# Patient Record
Sex: Female | Born: 1985
Health system: Southern US, Community
[De-identification: ages and names within clinical notes are randomized; demographics above are authoritative.]

## PROBLEM LIST (undated history)

## (undated) DIAGNOSIS — Z9889 Other specified postprocedural states: Secondary | ICD-10-CM

## (undated) DIAGNOSIS — N390 Urinary tract infection, site not specified: Secondary | ICD-10-CM

## (undated) DIAGNOSIS — N1370 Vesicoureteral-reflux, unspecified: Secondary | ICD-10-CM

## (undated) DIAGNOSIS — N189 Chronic kidney disease, unspecified: Secondary | ICD-10-CM

## (undated) DIAGNOSIS — F32A Depression, unspecified: Secondary | ICD-10-CM

## (undated) DIAGNOSIS — F329 Major depressive disorder, single episode, unspecified: Secondary | ICD-10-CM

## (undated) DIAGNOSIS — F419 Anxiety disorder, unspecified: Secondary | ICD-10-CM

## (undated) DIAGNOSIS — B999 Unspecified infectious disease: Secondary | ICD-10-CM

## (undated) DIAGNOSIS — J45909 Unspecified asthma, uncomplicated: Secondary | ICD-10-CM

## (undated) DIAGNOSIS — R112 Nausea with vomiting, unspecified: Secondary | ICD-10-CM

## (undated) HISTORY — PX: URINARY SURGERY: SHX2626

## (undated) HISTORY — PX: NECK SURGERY: SHX720

## (undated) HISTORY — DX: Depression, unspecified: F32.A

## (undated) HISTORY — PX: HERNIA REPAIR: SHX51

## (undated) HISTORY — DX: Major depressive disorder, single episode, unspecified: F32.9

## (undated) HISTORY — DX: Chronic kidney disease, unspecified: N18.9

## (undated) HISTORY — DX: Unspecified infectious disease: B99.9

## (undated) HISTORY — PX: MOUTH SURGERY: SHX715

---
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
## 2007-05-19 ENCOUNTER — Emergency Department (HOSPITAL_COMMUNITY): Admission: EM | Admit: 2007-05-19 | Discharge: 2007-05-19 | Payer: Self-pay | Admitting: Emergency Medicine

## 2009-10-06 ENCOUNTER — Inpatient Hospital Stay (HOSPITAL_COMMUNITY): Admission: AD | Admit: 2009-10-06 | Discharge: 2009-10-07 | Payer: Self-pay | Admitting: Obstetrics & Gynecology

## 2009-10-06 ENCOUNTER — Other Ambulatory Visit: Payer: Self-pay | Admitting: Emergency Medicine

## 2009-10-06 DIAGNOSIS — N390 Urinary tract infection, site not specified: Secondary | ICD-10-CM

## 2009-10-06 DIAGNOSIS — O2390 Unspecified genitourinary tract infection in pregnancy, unspecified trimester: Secondary | ICD-10-CM

## 2010-01-30 ENCOUNTER — Inpatient Hospital Stay (HOSPITAL_COMMUNITY)
Admission: AD | Admit: 2010-01-30 | Discharge: 2010-02-01 | Payer: Self-pay | Source: Home / Self Care | Attending: Obstetrics & Gynecology | Admitting: Obstetrics & Gynecology

## 2010-02-04 LAB — CBC
HCT: 34.2 % — ABNORMAL LOW (ref 36.0–46.0)
HCT: 27.8 % — ABNORMAL LOW (ref 36.0–46.0)
Hemoglobin: 12.4 g/dL (ref 12.0–15.0)
Hemoglobin: 10.1 g/dL — ABNORMAL LOW (ref 12.0–15.0)
MCH: 34.5 pg — ABNORMAL HIGH (ref 26.0–34.0)
MCH: 34.9 pg — ABNORMAL HIGH (ref 26.0–34.0)
MCHC: 36.3 g/dL — ABNORMAL HIGH (ref 30.0–36.0)
MCHC: 36.3 g/dL — ABNORMAL HIGH (ref 30.0–36.0)
MCV: 95.3 fL (ref 78.0–100.0)
MCV: 96.2 fL (ref 78.0–100.0)
Platelets: 217 10*3/uL (ref 150–400)
Platelets: 210 10*3/uL (ref 150–400)
RBC: 3.59 MIL/uL — ABNORMAL LOW (ref 3.87–5.11)
RBC: 2.89 MIL/uL — ABNORMAL LOW (ref 3.87–5.11)
RDW: 13.9 % (ref 11.5–15.5)
RDW: 13.9 % (ref 11.5–15.5)
WBC: 13.9 10*3/uL — ABNORMAL HIGH (ref 4.0–10.5)
WBC: 20 10*3/uL — ABNORMAL HIGH (ref 4.0–10.5)

## 2010-02-04 LAB — RPR: RPR Ser Ql: NONREACTIVE

## 2010-04-04 LAB — URINALYSIS, ROUTINE W REFLEX MICROSCOPIC
Bilirubin Urine: NEGATIVE
Glucose, UA: NEGATIVE mg/dL
Hgb urine dipstick: NEGATIVE
Ketones, ur: NEGATIVE mg/dL
Nitrite: POSITIVE — AB
Protein, ur: NEGATIVE mg/dL
Specific Gravity, Urine: 1.012 (ref 1.005–1.030)
Urobilinogen, UA: 0.2 mg/dL (ref 0.0–1.0)
pH: 7 (ref 5.0–8.0)

## 2010-04-04 LAB — URINE MICROSCOPIC-ADD ON

## 2010-10-15 LAB — BASIC METABOLIC PANEL
BUN: 6
CO2: 27
Calcium: 9.1
Chloride: 106
Creatinine, Ser: 0.67
GFR calc Af Amer: >60
GFR calc non Af Amer: >60
Glucose, Bld: 101 — ABNORMAL HIGH
Potassium: 3.8
Sodium: 141

## 2010-10-15 LAB — DIFFERENTIAL
Basophils Absolute: 0
Basophils Relative: 1
Eosinophils Absolute: 0
Eosinophils Relative: 1
Lymphocytes Relative: 23
Lymphs Abs: 1.1
Monocytes Absolute: 0.5
Monocytes Relative: 12
Neutro Abs: 3
Neutrophils Relative %: 64

## 2010-10-15 LAB — URINE MICROSCOPIC-ADD ON

## 2010-10-15 LAB — URINALYSIS, ROUTINE W REFLEX MICROSCOPIC
Bilirubin Urine: NEGATIVE
Glucose, UA: NEGATIVE
Hgb urine dipstick: NEGATIVE
Ketones, ur: NEGATIVE
Nitrite: NEGATIVE
Protein, ur: NEGATIVE
Specific Gravity, Urine: 1.008
Urobilinogen, UA: 0.2
pH: 6.5

## 2010-10-15 LAB — CBC
HCT: 42.3
Hemoglobin: 14.6
MCHC: 34.5
MCV: 94
Platelets: 244
RBC: 4.5
RDW: 12.5
WBC: 4.7

## 2010-10-15 LAB — PREGNANCY, URINE: Preg Test, Ur: NEGATIVE

## 2011-03-28 ENCOUNTER — Other Ambulatory Visit: Payer: Self-pay | Admitting: Otolaryngology

## 2011-03-28 DIAGNOSIS — Q892 Congenital malformations of other endocrine glands: Secondary | ICD-10-CM

## 2011-04-01 ENCOUNTER — Ambulatory Visit
Admission: RE | Admit: 2011-04-01 | Discharge: 2011-04-01 | Disposition: A | Discharge status: Home / Self Care | Payer: 59 | Source: Ambulatory Visit | Attending: Otolaryngology | Admitting: Otolaryngology

## 2011-04-01 DIAGNOSIS — Q892 Congenital malformations of other endocrine glands: Secondary | ICD-10-CM

## 2011-04-01 MED ORDER — IOHEXOL 300 MG/ML  SOLN
75.0000 mL | Freq: Once | INTRAMUSCULAR | Status: AC | PRN
  Administered 2011-04-01: 75 mL via INTRAVENOUS

## 2011-04-25 ENCOUNTER — Encounter (HOSPITAL_COMMUNITY): Payer: Self-pay | Admitting: Pharmacy Technician

## 2011-04-30 ENCOUNTER — Other Ambulatory Visit: Payer: Self-pay | Admitting: Otolaryngology

## 2011-05-05 ENCOUNTER — Encounter (HOSPITAL_COMMUNITY): Payer: Self-pay

## 2011-05-05 ENCOUNTER — Encounter (HOSPITAL_COMMUNITY)
Admission: RE | Admit: 2011-05-05 | Discharge: 2011-05-05 | Disposition: A | Discharge status: Home / Self Care | Payer: 59 | Source: Ambulatory Visit | Attending: Otolaryngology | Admitting: Otolaryngology

## 2011-05-05 HISTORY — DX: Other specified postprocedural states: R11.2

## 2011-05-05 HISTORY — DX: Vesicoureteral-reflux, unspecified: N13.70

## 2011-05-05 HISTORY — DX: Other specified postprocedural states: Z98.890

## 2011-05-05 HISTORY — DX: Anxiety disorder, unspecified: F41.9

## 2011-05-05 LAB — CBC
HCT: 44 % (ref 36.0–46.0)
MCHC: 35.2 g/dL (ref 30.0–36.0)
RDW: 12.9 % (ref 11.5–15.5)
WBC: 8.1 10*3/uL (ref 4.0–10.5)

## 2011-05-05 LAB — SURGICAL PCR SCREEN
MRSA, PCR: NEGATIVE
Staphylococcus aureus: NEGATIVE

## 2011-05-05 LAB — HCG, SERUM, QUALITATIVE: Preg, Serum: NEGATIVE

## 2011-05-05 NOTE — Pre-Procedure Instructions (Signed)
20 Morgan Day  05/05/2011   Your procedure is scheduled on:  Friday, April 19th @ 7:30AM.  Report to [facility] at 5:30 AM.  Call this number if you have problems the morning of surgery: [phone]   Remember:   Do not eat food:After Midnight.  May have clear liquids: up to 4 Hours before arrival(nothing after 1:30AM).  Clear liquids include soda, tea, black coffee, apple or grape juice, broth.  Take these medicines the morning of surgery with A SIP OF WATER: Clonazepam   Do not wear jewelry, make-up or nail polish.  Do not wear lotions, powders, or perfumes. You may wear deodorant.  Do not shave 48 hours prior to surgery.  Do not bring valuables to the hospital.  Contacts, dentures or bridgework may not be worn into surgery.  Leave suitcase in the car. After surgery it may be brought to your room.  For patients admitted to the hospital, checkout time is 11:00 AM the day of discharge.   Patients discharged the day of surgery will not be allowed to drive home.  Name and phone number of your driver:   Special Instructions: CHG Shower Use Special Wash: 1/2 bottle night before surgery and 1/2 bottle morning of surgery.   Please read over the following fact sheets that you were given: Pain Booklet, Coughing and Deep Breathing and Surgical Site Infection Prevention

## 2011-05-05 NOTE — Pre-Procedure Instructions (Signed)
20 Morgan Day  05/05/2011   Your procedure is scheduled on:  Friday, April 19th @ 7:30AM.  Report to [facility] at 5:30 AM.  Call this number if you have problems the morning of surgery: [phone]   Remember:   Do not eat food:After Midnight.  May have clear liquids: up to 4 Hours before arrival(nothing after 1:30AM).  Clear liquids include soda, tea, black coffee, apple or grape juice, broth.  Take these medicines the morning of surgery with A SIP OF WATER: Clonazepam  Stop omega 3, multi vit, coq10 now   Do not wear jewelry, make-up or nail polish.  Do not wear lotions, powders, or perfumes. You may wear deodorant.  Do not shave 48 hours prior to surgery.  Do not bring valuables to the hospital.  Contacts, dentures or bridgework may not be worn into surgery.  Leave suitcase in the car. After surgery it may be brought to your room.  For patients admitted to the hospital, checkout time is 11:00 AM the day of discharge.   Patients discharged the day of surgery will not be allowed to drive home.  Name and phone number of your driver: Morgan Day [phone]  Special Instructions: CHG Shower Use Special Wash: 1/2 bottle night before surgery and 1/2 bottle morning of surgery.   Please read over the following fact sheets that you were given: Pain Booklet, Coughing and Deep Breathing and Surgical Site Infection Prevention

## 2011-05-08 MED ORDER — CEFAZOLIN SODIUM 1-5 GM-% IV SOLN
1.0000 g | INTRAVENOUS | Status: AC
  Administered 2011-05-09: 1 g via INTRAVENOUS
  Filled 2011-05-08: qty 50

## 2011-05-08 MED ORDER — DEXAMETHASONE SODIUM PHOSPHATE 10 MG/ML IJ SOLN
10.0000 mg | Freq: Once | INTRAMUSCULAR | Status: AC
  Administered 2011-05-09: 10 mg via INTRAVENOUS
  Filled 2011-05-08: qty 1

## 2011-05-09 ENCOUNTER — Encounter (HOSPITAL_COMMUNITY): Payer: Self-pay | Admitting: Anesthesiology

## 2011-05-09 ENCOUNTER — Ambulatory Visit (HOSPITAL_COMMUNITY)
Admission: RE | Admit: 2011-05-09 | Discharge: 2011-05-10 | Disposition: A | Discharge status: Home / Self Care | Payer: 59 | Source: Ambulatory Visit | Attending: Otolaryngology | Admitting: Otolaryngology

## 2011-05-09 ENCOUNTER — Encounter (HOSPITAL_COMMUNITY): Payer: Self-pay | Admitting: *Deleted

## 2011-05-09 ENCOUNTER — Encounter (HOSPITAL_COMMUNITY)
Admission: RE | Disposition: A | Discharge status: Home / Self Care | Payer: Self-pay | Source: Ambulatory Visit | Attending: Otolaryngology

## 2011-05-09 ENCOUNTER — Ambulatory Visit (HOSPITAL_COMMUNITY): Payer: 59 | Admitting: Anesthesiology

## 2011-05-09 DIAGNOSIS — R221 Localized swelling, mass and lump, neck: Secondary | ICD-10-CM | POA: Insufficient documentation

## 2011-05-09 DIAGNOSIS — Z01812 Encounter for preprocedural laboratory examination: Secondary | ICD-10-CM | POA: Insufficient documentation

## 2011-05-09 DIAGNOSIS — Q892 Congenital malformations of other endocrine glands: Secondary | ICD-10-CM | POA: Insufficient documentation

## 2011-05-09 DIAGNOSIS — F411 Generalized anxiety disorder: Secondary | ICD-10-CM | POA: Insufficient documentation

## 2011-05-09 DIAGNOSIS — R220 Localized swelling, mass and lump, head: Secondary | ICD-10-CM | POA: Insufficient documentation

## 2011-05-09 DIAGNOSIS — J45909 Unspecified asthma, uncomplicated: Secondary | ICD-10-CM | POA: Insufficient documentation

## 2011-05-09 DIAGNOSIS — F17200 Nicotine dependence, unspecified, uncomplicated: Secondary | ICD-10-CM | POA: Insufficient documentation

## 2011-05-09 HISTORY — PX: THYROGLOSSAL DUCT CYST: SHX297

## 2011-05-09 SURGERY — EXCISION, THYROGLOSSAL DUCT CYST
Anesthesia: General | Site: Neck | Wound class: Clean

## 2011-05-09 MED ORDER — LIDOCAINE HCL 4 % MT SOLN
OROMUCOSAL | Status: DC | PRN
  Administered 2011-05-09: 4 mL via TOPICAL

## 2011-05-09 MED ORDER — ONDANSETRON HCL 4 MG/2ML IJ SOLN: 4.0000 mg | Freq: Once | INTRAMUSCULAR | Status: DC | PRN

## 2011-05-09 MED ORDER — MORPHINE SULFATE 4 MG/ML IJ SOLN
2.0000 mg | INTRAMUSCULAR | Status: DC | PRN
  Administered 2011-05-09 (×2): 4 mg via INTRAVENOUS
  Filled 2011-05-09: qty 1

## 2011-05-09 MED ORDER — LIDOCAINE-EPINEPHRINE 1 %-1:100000 IJ SOLN
INTRAMUSCULAR | Status: DC | PRN
  Administered 2011-05-09: 1 mL

## 2011-05-09 MED ORDER — CEFAZOLIN SODIUM 1-5 GM-% IV SOLN
1.0000 g | Freq: Three times a day (TID) | INTRAVENOUS | Status: DC
  Administered 2011-05-09 – 2011-05-10 (×3): 1 g via INTRAVENOUS
  Filled 2011-05-09 (×5): qty 50

## 2011-05-09 MED ORDER — ARTIFICIAL TEARS OP OINT
TOPICAL_OINTMENT | OPHTHALMIC | Status: DC | PRN
  Administered 2011-05-09: 1 via OPHTHALMIC

## 2011-05-09 MED ORDER — DEXTROSE IN LACTATED RINGERS 5 % IV SOLN
INTRAVENOUS | Status: DC
  Administered 2011-05-10: 01:00:00 via INTRAVENOUS

## 2011-05-09 MED ORDER — NEOSTIGMINE METHYLSULFATE 1 MG/ML IJ SOLN
INTRAMUSCULAR | Status: DC | PRN
  Administered 2011-05-09: 3 mg via INTRAVENOUS

## 2011-05-09 MED ORDER — HYDROCODONE-ACETAMINOPHEN 7.5-500 MG/15ML PO SOLN
10.0000 mL | ORAL | Status: DC | PRN
  Administered 2011-05-09 – 2011-05-10 (×2): 15 mL via ORAL
  Filled 2011-05-09 (×2): qty 15

## 2011-05-09 MED ORDER — ONDANSETRON HCL 4 MG PO TABS: 4.0000 mg | ORAL_TABLET | ORAL | Status: DC | PRN

## 2011-05-09 MED ORDER — HYDROMORPHONE HCL PF 1 MG/ML IJ SOLN
0.2500 mg | INTRAMUSCULAR | Status: DC | PRN
  Administered 2011-05-09: 0.5 mg via INTRAVENOUS
  Administered 2011-05-09 (×2): 0.25 mg via INTRAVENOUS

## 2011-05-09 MED ORDER — ACETAMINOPHEN 160 MG/5ML PO SOLN: 650.0000 mg | ORAL | Status: DC | PRN

## 2011-05-09 MED ORDER — ACETAMINOPHEN 650 MG RE SUPP: 650.0000 mg | RECTAL | Status: DC | PRN

## 2011-05-09 MED ORDER — LACTATED RINGERS IV SOLN
INTRAVENOUS | Status: DC | PRN
  Administered 2011-05-09: 07:00:00 via INTRAVENOUS

## 2011-05-09 MED ORDER — LIDOCAINE HCL 1 % IJ SOLN
INTRAMUSCULAR | Status: DC | PRN
  Administered 2011-05-09: 80 mg via INTRADERMAL

## 2011-05-09 MED ORDER — MIDAZOLAM HCL 5 MG/5ML IJ SOLN
INTRAMUSCULAR | Status: DC | PRN
  Administered 2011-05-09: 2 mg via INTRAVENOUS

## 2011-05-09 MED ORDER — SODIUM CHLORIDE 0.9 % IR SOLN
Status: DC | PRN
  Administered 2011-05-09: 1000 mL

## 2011-05-09 MED ORDER — ONDANSETRON HCL 4 MG/2ML IJ SOLN
4.0000 mg | INTRAMUSCULAR | Status: DC | PRN
  Administered 2011-05-09 (×3): 4 mg via INTRAVENOUS
  Filled 2011-05-09 (×3): qty 2

## 2011-05-09 MED ORDER — ROCURONIUM BROMIDE 100 MG/10ML IV SOLN
INTRAVENOUS | Status: DC | PRN
  Administered 2011-05-09: 40 mg via INTRAVENOUS

## 2011-05-09 MED ORDER — MORPHINE SULFATE 2 MG/ML IJ SOLN: 0.0500 mg/kg | INTRAMUSCULAR | Status: DC | PRN

## 2011-05-09 MED ORDER — ONDANSETRON HCL 4 MG/2ML IJ SOLN
INTRAMUSCULAR | Status: DC | PRN
  Administered 2011-05-09 (×2): 4 mg via INTRAVENOUS

## 2011-05-09 MED ORDER — HYDROCODONE-ACETAMINOPHEN 5-325 MG PO TABS
1.0000 | ORAL_TABLET | ORAL | Status: DC | PRN
  Administered 2011-05-10: 1 via ORAL
  Filled 2011-05-09: qty 1

## 2011-05-09 MED ORDER — PROPOFOL 10 MG/ML IV EMUL
INTRAVENOUS | Status: DC | PRN
  Administered 2011-05-09: 120 mg via INTRAVENOUS

## 2011-05-09 MED ORDER — MORPHINE SULFATE 4 MG/ML IJ SOLN
INTRAMUSCULAR | Status: AC
  Administered 2011-05-09: 4 mg via INTRAVENOUS
  Filled 2011-05-09: qty 1

## 2011-05-09 MED ORDER — FENTANYL CITRATE 0.05 MG/ML IJ SOLN
INTRAMUSCULAR | Status: DC | PRN
  Administered 2011-05-09: 150 ug via INTRAVENOUS

## 2011-05-09 MED ORDER — CLONAZEPAM 0.5 MG PO TABS: 0.5000 mg | ORAL_TABLET | Freq: Two times a day (BID) | ORAL | Status: DC | PRN

## 2011-05-09 MED ORDER — GLYCOPYRROLATE 0.2 MG/ML IJ SOLN
INTRAMUSCULAR | Status: DC | PRN
  Administered 2011-05-09: 0.4 mg via INTRAVENOUS

## 2011-05-09 SURGICAL SUPPLY — 45 items
APPLIER CLIP 9.375 SM OPEN (CLIP)
BLADE SURG 10 STRL SS (BLADE) ×2 IMPLANT
CANISTER SUCTION 2500CC (MISCELLANEOUS) ×2 IMPLANT
CLEANER TIP ELECTROSURG 2X2 (MISCELLANEOUS) ×2 IMPLANT
CLIP APPLIE 9.375 SM OPEN (CLIP) IMPLANT
CLOTH BEACON ORANGE TIMEOUT ST (SAFETY) ×2 IMPLANT
CONT SPEC 4OZ CLIKSEAL STRL BL (MISCELLANEOUS) IMPLANT
CORDS BIPOLAR (ELECTRODE) ×2 IMPLANT
COVER SURGICAL LIGHT HANDLE (MISCELLANEOUS) ×2 IMPLANT
CRADLE DONUT ADULT HEAD (MISCELLANEOUS) ×2 IMPLANT
DECANTER SPIKE VIAL GLASS SM (MISCELLANEOUS) ×2 IMPLANT
DERMABOND ADVANCED (GAUZE/BANDAGES/DRESSINGS) ×1
DERMABOND ADVANCED .7 DNX12 (GAUZE/BANDAGES/DRESSINGS) ×1 IMPLANT
DRAIN JACKSON RD 7FR 3/32 (WOUND CARE) ×2 IMPLANT
ELECT COATED BLADE 2.86 ST (ELECTRODE) ×2 IMPLANT
ELECT REM PT RETURN 9FT ADLT (ELECTROSURGICAL) ×2
ELECTRODE REM PT RTRN 9FT ADLT (ELECTROSURGICAL) ×1 IMPLANT
EVACUATOR SILICONE 100CC (DRAIN) ×2 IMPLANT
GAUZE SPONGE 4X4 16PLY XRAY LF (GAUZE/BANDAGES/DRESSINGS) ×2 IMPLANT
GLOVE BIO SURGEON STRL SZ7.5 (GLOVE) ×2 IMPLANT
GLOVE BIOGEL M 7.0 STRL (GLOVE) ×2 IMPLANT
GLOVE ECLIPSE 6.5 STRL STRAW (GLOVE) ×2 IMPLANT
GLOVE SURG SS PI 6.5 STRL IVOR (GLOVE) ×2 IMPLANT
GOWN STRL NON-REIN LRG LVL3 (GOWN DISPOSABLE) ×6 IMPLANT
KIT BASIN OR (CUSTOM PROCEDURE TRAY) ×2 IMPLANT
KIT ROOM TURNOVER OR (KITS) ×2 IMPLANT
MARKER SKIN DUAL TIP RULER LAB (MISCELLANEOUS) IMPLANT
NS IRRIG 1000ML POUR BTL (IV SOLUTION) ×2 IMPLANT
PAD ARMBOARD 7.5X6 YLW CONV (MISCELLANEOUS) ×4 IMPLANT
PENCIL BUTTON HOLSTER BLD 10FT (ELECTRODE) ×2 IMPLANT
SPONGE INTESTINAL PEANUT (DISPOSABLE) IMPLANT
STAPLER VISISTAT 35W (STAPLE) ×2 IMPLANT
STRIP CLOSURE SKIN 1/2X4 (GAUZE/BANDAGES/DRESSINGS) IMPLANT
SUT CHROMIC 2 0 SH (SUTURE) ×2 IMPLANT
SUT ETHILON 3 0 FSL (SUTURE) ×2 IMPLANT
SUT ETHILON 5 0 PS 2 18 (SUTURE) IMPLANT
SUT SILK 2 0 REEL (SUTURE) IMPLANT
SUT SILK 3 0 REEL (SUTURE) ×2 IMPLANT
SUT SILK 4 0 REEL (SUTURE) IMPLANT
SUT VIC AB 3-0 FS2 27 (SUTURE) ×2 IMPLANT
SUT VICRYL 4-0 PS2 18IN ABS (SUTURE) ×2 IMPLANT
TOWEL OR 17X24 6PK STRL BLUE (TOWEL DISPOSABLE) ×2 IMPLANT
TOWEL OR 17X26 10 PK STRL BLUE (TOWEL DISPOSABLE) ×2 IMPLANT
TRAY ENT MC OR (CUSTOM PROCEDURE TRAY) ×2 IMPLANT
WATER STERILE IRR 1000ML POUR (IV SOLUTION) IMPLANT

## 2011-05-09 NOTE — Anesthesia Postprocedure Evaluation (Signed)
  Anesthesia Post-op Note  Patient: Morgan Day  Procedure(s) Performed: Procedure(s) (LRB): THYROGLOSSAL DUCT CYST (N/A)  Patient Location: PACU  Anesthesia Type: General  Level of Consciousness: awake  Airway and Oxygen Therapy: Patient Spontanous Breathing  Post-op Pain: mild  Post-op Assessment: Post-op Vital signs reviewed, Patient's Cardiovascular Status Stable, Respiratory Function Stable, Patent Airway, No signs of Nausea or vomiting and Pain level controlled  Post-op Vital Signs: stable  Complications: No apparent anesthesia complications

## 2011-05-09 NOTE — Anesthesia Preprocedure Evaluation (Addendum)
Anesthesia Evaluation  Patient identified by MRN, date of birth, ID band Patient awake    Reviewed: Allergy & Precautions, H&P , NPO status , Patient's Chart, lab work & pertinent test results  History of Anesthesia Complications (+) PONV  Airway Mallampati: II TM Distance: >3 FB Neck ROM: Full    Dental  (+) Teeth Intact and Dental Advisory Given,    Pulmonary asthma (exercise induced) , Current Smoker,          Cardiovascular negative cardio ROS      Neuro/Psych PSYCHIATRIC DISORDERS Anxiety negative neurological ROS     GI/Hepatic negative GI ROS, Neg liver ROS, Thyroglossal duct cyst   Endo/Other  negative endocrine ROS  Renal/GU negative Renal ROS  negative genitourinary   Musculoskeletal negative musculoskeletal ROS (+)   Abdominal   Peds negative pediatric ROS (+)  Hematology negative hematology ROS (+)   Anesthesia Other Findings   Reproductive/Obstetrics negative OB ROS                           Anesthesia Physical Anesthesia Plan  ASA: II  Anesthesia Plan: General   Post-op Pain Management:    Induction: Intravenous  Airway Management Planned: Oral ETT  Additional Equipment:   Intra-op Plan:   Post-operative Plan: Extubation in OR  Informed Consent: I have reviewed the patients History and Physical, chart, labs and discussed the procedure including the risks, benefits and alternatives for the proposed anesthesia with the patient or authorized representative who has indicated his/her understanding and acceptance.     Plan Discussed with: CRNA and Surgeon  Anesthesia Plan Comments:         Anesthesia Quick Evaluation

## 2011-05-09 NOTE — H&P (Signed)
Morgan Day is an 26 y.o. female.   Chief Complaint: Ant. Neck mass HPI: Intermittent swelling of ant neck mass, tx'ed with abx  Past Medical History  Diagnosis Date  . Asthma     exercise asthma  . Urinary reflux     as child  . Anxiety   . PONV (postoperative nausea and vomiting)     Past Surgical History  Procedure Date  . Urinary surgery     for reflux   as child  . Mouth surgery     History reviewed. No pertinent family history. Social History:  reports that she has been smoking Cigarettes.  She has a 4 pack-year smoking history. She does not have any smokeless tobacco history on file. She reports that she drinks alcohol. She reports that she does not use illicit drugs.  Allergies:  Allergies  Allergen Reactions  . Other     Narcotic pain medicine she reports makes her nauseated    Medications Prior to Admission  Medication Dose Route Frequency Provider Last Rate Last Dose  . ceFAZolin (ANCEF) IVPB 1 g/50 mL premix  1 g Intravenous 60 min Pre-Op Osborn Coho, MD      . dexamethasone (DECADRON) injection 10 mg  10 mg Intravenous Once Osborn Coho, MD      . lidocaine-EPINEPHrine (XYLOCAINE W/EPI) 1 %-1:100000 (with pres) injection    PRN Osborn Coho, MD   20 mL at 05/09/11 0725  . sodium chloride irrigation 0.9 %    PRN Osborn Coho, MD   1,000 mL at 05/09/11 0725   Medications Prior to Admission  Medication Sig Dispense Refill  . clonazePAM (KLONOPIN) 0.5 MG tablet Take 0.5 mg by mouth 2 (two) times daily as needed. For anxiety      . Levonorgestrel-Ethinyl Estrad (ALTAVERA PO) Take 1 tablet by mouth daily.        No results found for this or any previous visit (from the past 48 hour(s)). No results found.  Review of Systems  Constitutional: Negative.   Respiratory: Negative.   Cardiovascular: Negative.   Musculoskeletal: Negative.   Skin: Negative.   Neurological: Negative.     Blood pressure 118/76, pulse 99, temperature 97.5 F (36.4  C), temperature source Oral, resp. rate 18, SpO2 100.00%. Physical Exam  Constitutional: She is oriented to person, place, and time. She appears well-developed and well-nourished.  HENT:  Head: Normocephalic and atraumatic.  Nose: Nose normal.  Mouth/Throat: Oropharynx is clear and moist.  Neck: Normal range of motion. Neck supple.       Small subcutaneous mass Left ant neck  Cardiovascular: Normal rate and regular rhythm.   Respiratory: Effort normal and breath sounds normal.  GI: Soft. Bowel sounds are normal.  Musculoskeletal: Normal range of motion.  Neurological: She is alert and oriented to person, place, and time.     Assessment/Plan Adm for exc of ant neck mass, possible TGD cyst.  Ladarrious Kirksey 05/09/2011, 7:36 AM

## 2011-05-09 NOTE — Brief Op Note (Signed)
05/09/2011  8:58 AM  PATIENT:  Morgan Day  26 y.o. female  PRE-OPERATIVE DIAGNOSIS:  Thyroglossal Duct Cyst  POST-OPERATIVE DIAGNOSIS:  Thyroglossal Duct Cyst  PROCEDURE:  Procedure(s) (LRB): THYROGLOSSAL DUCT CYST (N/A)  SURGEON:  Surgeon(s) and Role:    * Christia Reading, MD - Assisting    * Osborn Coho, MD - Primary  PHYSICIAN ASSISTANT:   ASSISTANTS: Bates   ANESTHESIA:   general  EBL:   Min  BLOOD ADMINISTERED:none  DRAINS: (7 FR) Jackson-Pratt drain(s) with closed bulb suction in the Incision   LOCAL MEDICATIONS USED:  LIDOCAINE  and Amount: 1 ml  SPECIMEN:  Source of Specimen:  Ant neck cyst  DISPOSITION OF SPECIMEN:  PATHOLOGY  COUNTS:  YES  TOURNIQUET:  * No tourniquets in log *  DICTATION: .Other Dictation: Dictation Number [id number]  PLAN OF CARE: Admit for overnight observation  PATIENT DISPOSITION:  PACU - hemodynamically stable.   Delay start of Pharmacological VTE agent (>24hrs) due to surgical blood loss or risk of bleeding: yes

## 2011-05-09 NOTE — Anesthesia Procedure Notes (Signed)
Procedure Name: Intubation Date/Time: 05/09/2011 7:49 AM Performed by: Leona Singleton A Pre-anesthesia Checklist: Patient identified Patient Re-evaluated:Patient Re-evaluated prior to inductionOxygen Delivery Method: Circle system utilized Preoxygenation: Pre-oxygenation with 100% oxygen Intubation Type: IV induction Ventilation: Mask ventilation without difficulty Laryngoscope Size: Miller and 2 Grade View: Grade I Tube type: Oral Rae Tube size: 7.0 mm Number of attempts: 1 Airway Equipment and Method: Stylet and LTA kit utilized Placement Confirmation: ETT inserted through vocal cords under direct vision,  positive ETCO2 and breath sounds checked- equal and bilateral Secured at: 21 cm Tube secured with: Tape Dental Injury: Teeth and Oropharynx as per pre-operative assessment

## 2011-05-09 NOTE — Preoperative (Signed)
Beta Blockers   Reason not to administer Beta Blockers:Not Applicable 

## 2011-05-09 NOTE — Anesthesia Postprocedure Evaluation (Signed)
Anesthesia Post Note  Patient: Morgan Day  Procedure(s) Performed: Procedure(s) (LRB): THYROGLOSSAL DUCT CYST (N/A)  Anesthesia type: General  Patient location: PACU  Post pain: Pain level controlled and Adequate analgesia  Post assessment: Post-op Vital signs reviewed, Patient's Cardiovascular Status Stable, Respiratory Function Stable, Patent Airway and Pain level controlled  Last Vitals:  Filed Vitals:   05/09/11 0947  BP: 107/57  Pulse: 58  Temp:   Resp: 13    Post vital signs: Reviewed and stable  Level of consciousness: awake, alert  and oriented  Complications: No apparent anesthesia complications

## 2011-05-09 NOTE — Transfer of Care (Signed)
Immediate Anesthesia Transfer of Care Note  Patient: Morgan Day  Procedure(s) Performed: Procedure(s) (LRB): THYROGLOSSAL DUCT CYST (N/A)  Patient Location: PACU  Anesthesia Type: General  Level of Consciousness: awake, alert , oriented and patient cooperative  Airway & Oxygen Therapy: Patient Spontanous Breathing and Patient connected to nasal cannula oxygen  Post-op Assessment: Report given to PACU RN and Post -op Vital signs reviewed and stable  Post vital signs: Reviewed and stable  Complications: No apparent anesthesia complications

## 2011-05-09 NOTE — Op Note (Signed)
Morgan Day, Morgan Day              ACCOUNT NO.:  [id number]  MEDICAL RECORD NO.:  [id number]  LOCATION:  [facility]                         FACILITY:  [facility]  PHYSICIAN:  Kinnie Scales. Annalee Genta, M.D.DATE OF BIRTH:  08-Jan-1986  DATE OF PROCEDURE:  05/09/2011 DATE OF DISCHARGE:                              OPERATIVE REPORT   LOCATION:  [facility] OR.  PREOPERATIVE DIAGNOSES: 1. Anterior neck mass. 2. Thyroglossal duct cyst.  POSTOPERATIVE DIAGNOSES: 1. Anterior neck mass. 2. Thyroglossal duct cyst.  INDICATION FOR SURGERY: 1. Anterior neck mass. 2. Thyroglossal duct cyst.  SURGICAL PROCEDURE:  Excision of thyroglossal duct cyst with excision of central hyoid bone.  SURGEON:  Kinnie Scales. Annalee Genta, MD  ANESTHESIA:  General endotracheal.  ASSISTANT:  Antony Contras, MD  COMPLICATIONS:  No complications.  BLOOD LOSS:  Minimal.  The patient was transferred from the operating room to the recovery room in stable condition.  BRIEF HISTORY:  The patient is a 26 year old female referred to our office from the emergency department with a history of swelling in the anterior neck.  She has had a several-Losee history of intermittent swelling and discomfort, treated with antibiotics.  Gradually, the mass improved, but did not completely resolve.  Followup CT scan was performed that showed a cystic mass anterior and adjacent to the hyoid bone in the left anterior neck.  Given the history, examination, and CT findings, we felt this was either a small subcutaneous cyst versus a thyroglossal duct cyst.  Given the history, examination, and findings, we also recommended excision of the mass under general anesthesia.  The risks and benefits of the procedure were discussed in detail with the patient.  She understood and concurred with our plan for surgery which was scheduled under general anesthesia at [facility] with intended overnight observation.  PROCEDURE:  The patient  was brought to the operating room and placed in supine position on the operating table.  General endotracheal anesthesia was established without difficulty.  The patient was adequately anesthetized.  Her anterior neck skin was injected with a total of 1 mL of 1% lidocaine, 1:100,000 solution epinephrine injected in a subcutaneous fashion along the proposed incision line.  She was then prepped and draped and positioned.  After allowing adequate time for vasoconstriction and hemostasis, a 3-cm anterior neck incision was created with a #15 scalpel and this was carried through the skin underlying subcutaneous tissue.  The platysma muscles were identified bilaterally and the anterior components divided and subplatysmal flaps elevated superiorly and inferiorly.  Small solid/cystic-appearing mass was palpable overlying the left anterior aspect of the hyoid bone.  Careful dissection was then carried out dividing the strap muscles and skeletonizing the anterior aspect of the hyoid bone.  The central component was then resected using through- cutting bone rongeur.  The cystic mass was densely adherent to the anterior surface of the central hyoid and surrounding musculature was dissected using Bovie electrocautery.  Central hyoid bone and associated cysts were removed and sent to Pathology for gross microscopic evaluation.  There was no evidence of additional tract, cyst, or mass. The patient's neck was then irrigated with sterile saline solution and reconstruction was undertaken.  A 7-French round drain was placed at the depth of the incision and the incision was then closed in layers beginning with reapproximation of the platysma muscle with 3-0 Vicryl suture in an interrupted fashion, deep subcutaneous closure consisting of 4-0 Vicryl suture and final skin edge closure with Dermabond surgical glue.  The patient was awakened from anesthetic. She was extubated and transferred from the operating  room to the recovery room in stable condition.  There were no complications.  Estimated blood loss was minimal.          ______________________________ Kinnie Scales. Annalee Genta, M.D.     DLS/MEDQ  D:  16/10/9602  T:  05/09/2011  Job:  [id number]

## 2011-05-09 NOTE — Progress Notes (Signed)
   ENT Progress Note:  s/p Procedure(s): THYROGLOSSAL DUCT CYST   Subjective: Stable, mod pain  Objective: Vital signs in last 24 hours: Temp:  [97.1 F (36.2 C)-97.9 F (36.6 C)] 97.9 F (36.6 C) (04/19 1315) Pulse Rate:  [49-99] 66  (04/19 1315) Resp:  [12-32] 12  (04/19 1315) BP: (97-118)/(57-76) 109/74 mmHg (04/19 1315) SpO2:  [100 %] 100 % (04/19 1315) Weight:  [58.7 kg (129 lb 6.6 oz)] 58.7 kg (129 lb 6.6 oz) (04/19 0901) Weight change:     Intake/Output from previous day:   Intake/Output this shift: Total I/O In: 1625 [P.O.:363; I.V.:1262] Out: 445 [Urine:400; Drains:20; Blood:25]  Labs:  Studies/Results: No results found.   PHYSICAL EXAM: Inc intact, no hematoma Airway stable    Assessment/Plan: O/N obs, monitor drain output    Clerence Gubser 05/09/2011, 5:12 PM

## 2011-05-10 MED ORDER — HYDROCODONE-ACETAMINOPHEN 5-325 MG PO TABS: 1.0000 | ORAL_TABLET | ORAL | Status: AC | PRN

## 2011-05-10 NOTE — Progress Notes (Signed)
   ENT Progress Note: POD #1 s/p Procedure(s): THYROGLOSSAL DUCT CYST   Subjective: Pt stable, min discomfort  Objective: Vital signs in last 24 hours: Temp:  [97.4 F (36.3 C)-98.1 F (36.7 C)] 97.7 F (36.5 C) (04/20 0624) Pulse Rate:  [56-82] 71  (04/20 0624) Resp:  [12-18] 18  (04/20 0624) BP: (91-111)/(50-76) 91/52 mmHg (04/20 0624) SpO2:  [98 %-100 %] 100 % (04/20 0624) Weight:  [58.514 kg (129 lb)] 58.514 kg (129 lb) (04/20 1610) Weight change:  Last BM Date: 05/09/11  Intake/Output from previous day: 04/19 0701 - 04/20 0700 In: 2696.1 [P.O.:363; I.V.:2233.1; IV Piggyback:100] Out: 1855 [Urine:1800; Drains:30; Blood:25] Intake/Output this shift: Total I/O In: 360 [P.O.:360] Out: -   Labs:  Studies/Results: No results found.   PHYSICAL EXAM: Inc intact, no swelling, no erythema JP 30cc since surgery - removed   Assessment/Plan: Stable post op. D/C to home    Rayen Dafoe 05/10/2011, 9:41 AM

## 2011-05-10 NOTE — Discharge Summary (Signed)
Physician Discharge Summary  Patient ID: Morgan Day MRN: [id number] DOB/AGE: 1985/11/29 26 y.o.  Admit date: 05/09/2011 Discharge date: 05/10/2011  Admission Diagnoses:  Active Problems:  * No active hospital problems. *    Discharge Diagnoses:  Same  Surgeries: Procedure(s): THYROGLOSSAL DUCT CYST on 05/09/2011   Consultants: none  Discharged Condition: Improved  Hospital Course: [name] is an 26 y.o. female who was admitted 05/09/2011 with a diagnosis of anterior neck mass and went to the operating room on 05/09/2011 and underwent the above named procedures.   Stable post op and d/c'ed to home on 4/20.  Recent vital signs:  Filed Vitals:   05/10/11 0624  BP: 91/52  Pulse: 71  Temp: 97.7 F (36.5 C)  Resp: 18    Recent laboratory studies:  Results for orders placed during the hospital encounter of 05/05/11  SURGICAL PCR SCREEN      Component Value Range   MRSA, PCR NEGATIVE  NEGATIVE    Staphylococcus aureus NEGATIVE  NEGATIVE   CBC      Component Value Range   WBC 8.1  4.0 - 10.5 (K/uL)   RBC 4.77  3.87 - 5.11 (MIL/uL)   Hemoglobin 15.5 (*) 12.0 - 15.0 (g/dL)   HCT 14.7  82.9 - 56.2 (%)   MCV 92.2  78.0 - 100.0 (fL)   MCH 32.5  26.0 - 34.0 (pg)   MCHC 35.2  30.0 - 36.0 (g/dL)   RDW 13.0  86.5 - 78.4 (%)   Platelets 291  150 - 400 (K/uL)  HCG, SERUM, QUALITATIVE      Component Value Range   Preg, Serum NEGATIVE  NEGATIVE     Discharge Medications:   Medication List  As of 05/10/2011  9:44 AM   ASK your doctor about these medications         ALTAVERA PO   Take 1 tablet by mouth daily.      calcium-vitamin D 250-125 MG-UNIT per tablet   Commonly known as: OSCAL WITH D   Take 1 tablet by mouth daily.      cholecalciferol 1000 UNITS tablet   Commonly known as: VITAMIN D   Take 1,000 Units by mouth daily.      clonazePAM 0.5 MG tablet   Commonly known as: KLONOPIN   Take 0.5 mg by mouth 2 (two) times daily as needed. For anxiety     co-enzyme Q-10 30 MG capsule   Take 30 mg by mouth 3 (three) times daily.      fish oil-omega-3 fatty acids 1000 MG capsule   Take 3 g by mouth daily.      multivitamin capsule   Take 3 capsules by mouth daily. greensourse            Diagnostic Studies: No results found.  Disposition:D/c to home.      Signed: [name] 05/10/2011, 9:44 AM

## 2011-05-12 ENCOUNTER — Encounter (HOSPITAL_COMMUNITY): Payer: Self-pay | Admitting: Otolaryngology

## 2011-05-14 ENCOUNTER — Emergency Department (HOSPITAL_COMMUNITY)
Admission: EM | Admit: 2011-05-14 | Discharge: 2011-05-14 | Disposition: A | Discharge status: Home / Self Care | Payer: 59 | Attending: Emergency Medicine | Admitting: Emergency Medicine

## 2011-05-14 ENCOUNTER — Encounter (HOSPITAL_COMMUNITY): Payer: Self-pay | Admitting: *Deleted

## 2011-05-14 DIAGNOSIS — K5900 Constipation, unspecified: Secondary | ICD-10-CM | POA: Insufficient documentation

## 2011-05-14 DIAGNOSIS — R5383 Other fatigue: Secondary | ICD-10-CM | POA: Insufficient documentation

## 2011-05-14 DIAGNOSIS — R109 Unspecified abdominal pain: Secondary | ICD-10-CM | POA: Insufficient documentation

## 2011-05-14 DIAGNOSIS — F17200 Nicotine dependence, unspecified, uncomplicated: Secondary | ICD-10-CM | POA: Insufficient documentation

## 2011-05-14 DIAGNOSIS — J45909 Unspecified asthma, uncomplicated: Secondary | ICD-10-CM | POA: Insufficient documentation

## 2011-05-14 DIAGNOSIS — E860 Dehydration: Secondary | ICD-10-CM | POA: Insufficient documentation

## 2011-05-14 DIAGNOSIS — R112 Nausea with vomiting, unspecified: Secondary | ICD-10-CM | POA: Insufficient documentation

## 2011-05-14 DIAGNOSIS — R5381 Other malaise: Secondary | ICD-10-CM | POA: Insufficient documentation

## 2011-05-14 LAB — BASIC METABOLIC PANEL
CO2: 24 mEq/L (ref 19–32)
Calcium: 10.2 mg/dL (ref 8.4–10.5)
Chloride: 99 mEq/L (ref 96–112)
GFR calc Af Amer: >90 mL/min (ref >90)
GFR calc non Af Amer: >90 mL/min (ref >90)
Glucose, Bld: 105 mg/dL — ABNORMAL HIGH (ref 70–99)
Potassium: 4 mEq/L (ref 3.5–5.1)

## 2011-05-14 MED ORDER — SODIUM CHLORIDE 0.9 % IV BOLUS (SEPSIS)
1000.0000 mL | Freq: Once | INTRAVENOUS | Status: AC
  Administered 2011-05-14: 1000 mL via INTRAVENOUS

## 2011-05-14 MED ORDER — DISPOSABLE ENEMA 19-7 GM/118ML RE ENEM: 1.0000 | ENEMA | Freq: Once | RECTAL | Status: AC

## 2011-05-14 MED ORDER — MAGNESIUM CITRATE PO SOLN: 296.0000 mL | Freq: Once | ORAL | Status: AC

## 2011-05-14 NOTE — Discharge Instructions (Signed)
Your blood tests do not show any significant illness.  Use a fleets enema to help yourself have a bowel movement.  Use magnesium citrate if the fleets enema is ineffective.  Return for worse symptoms.  Follow up with your Dr. if your symptoms.  Last more than 2-3 days

## 2011-05-14 NOTE — ED Provider Notes (Signed)
History     CSN: [id number]  Arrival date & time 05/14/11  1312   First MD Initiated Contact with Patient 05/14/11 1411      Chief Complaint  Patient presents with  . Abdominal Pain  . Constipation    (Consider location/radiation/quality/duration/timing/severity/associated sxs/prior treatment) Patient is a 26 y.o. female presenting with abdominal pain and constipation. The history is provided by the patient.  Abdominal Pain The primary symptoms of the illness include abdominal pain, nausea and vomiting. The primary symptoms of the illness do not include fever, shortness of breath or diarrhea.  Additional symptoms associated with the illness include constipation. Symptoms associated with the illness do not include chills.  Constipation  Associated symptoms include abdominal pain, nausea and vomiting. Pertinent negatives include no fever, no diarrhea, no chest pain, no headaches and no coughing.   the patient is a 33, old female, who had surgery on her throat 5 days ago.  Since then, she has not had a bowel movement.  She has abdominal cramps and constipation.  She had nausea and vomiting earlier.  This morning, but her nausea has resolved now.  She denies abdominal pain.  At this time.  She denies prior history of abdominal surgery.  She says that she did have a small bowel movement.  This morning.  Past Medical History  Diagnosis Date  . Asthma     exercise asthma  . Urinary reflux     as child  . Anxiety   . PONV (postoperative nausea and vomiting)     Past Surgical History  Procedure Date  . Urinary surgery     for reflux   as child  . Mouth surgery   . Thyroglossal duct cyst 05/09/2011  . Thyroglossal duct cyst 05/09/2011    Procedure: THYROGLOSSAL DUCT CYST;  Surgeon: Osborn Coho, MD;  Location: [facility] OR;  Service: ENT;  Laterality: N/A;    No family history on file.  History  Substance Use Topics  . Smoking status: Current Everyday Smoker -- 0.5 packs/day for 8  years    Types: Cigarettes  . Smokeless tobacco: Never Used   Comment: wants to quit smoking on own  . Alcohol Use: Yes     occasional    OB History    Grav Para Term Preterm Abortions TAB SAB Ect Mult Living                  Review of Systems  Constitutional: Negative for fever and chills.  Respiratory: Negative for cough and shortness of breath.   Cardiovascular: Negative for chest pain.  Gastrointestinal: Positive for nausea, vomiting, abdominal pain and constipation. Negative for diarrhea.       Nausea and vomiting are resolved No abdominal pain.  At this time  Neurological: Positive for weakness. Negative for headaches.  Psychiatric/Behavioral: Negative for confusion.  All other systems reviewed and are negative.    Allergies  Other  Home Medications   Current Outpatient Rx  Name Route Sig Dispense Refill  . CALCIUM CARBONATE-VITAMIN D 250-125 MG-UNIT PO TABS Oral Take 1 tablet by mouth daily.    Marland Kitchen VITAMIN D 1000 UNITS PO TABS Oral Take 1,000 Units by mouth daily.    Marland Kitchen CLONAZEPAM 0.5 MG PO TABS Oral Take 0.5 mg by mouth 2 (two) times daily as needed. For anxiety    . COENZYME Q10 30 MG PO CAPS Oral Take 30 mg by mouth 3 (three) times daily.    . OMEGA-3 FATTY ACIDS 1000  MG PO CAPS Oral Take 3 g by mouth daily.    Marland Kitchen HYDROCODONE-ACETAMINOPHEN 5-325 MG PO TABS Oral Take 1-2 tablets by mouth every 4 (four) hours as needed. 30 tablet 0  . ALTAVERA PO Oral Take 1 tablet by mouth daily.    . MULTIVITAMINS PO CAPS Oral Take 3 capsules by mouth daily. greensourse    . PROMETHAZINE HCL 25 MG RE SUPP Rectal Place 25 mg rectally every 6 (six) hours as needed. For nausea      BP 106/63  Pulse 91  Temp(Src) 98.2 F (36.8 C) (Oral)  Resp 16  SpO2 100%  LMP 04/07/2011  Physical Exam  Vitals reviewed. Constitutional: She is oriented to person, place, and time. She appears well-developed and well-nourished. No distress.       Listless  HENT:  Head: Normocephalic and  atraumatic.  Eyes: Conjunctivae are normal.  Neck: Normal range of motion. Neck supple.  Cardiovascular: Normal rate.   No murmur heard. Pulmonary/Chest: Effort normal and breath sounds normal. No respiratory distress.  Abdominal: Soft. She exhibits no distension. There is no tenderness. There is no rebound and no guarding.  Musculoskeletal: Normal range of motion.  Neurological: She is alert and oriented to person, place, and time.  Skin: Skin is warm and dry.  Psychiatric: She has a normal mood and affect. Thought content normal.    ED Course  Procedures (including critical care time) 26 year old female, presents with constipation 5 days after having surgery on her throat.  She had nausea and vomiting, which are now resolved.  She has mild hypertension, a benign abdomen, but appears listless.  We will establish an IV and give fluids.  There is no indication for medical intervention.  At this time.  Since she is asymptomatic   Labs Reviewed  BASIC METABOLIC PANEL   No results found.   No diagnosis found.  3:24 PM Sitting up, smiling. Feels better    MDM  Constipation Dehydration         Cheri Guppy, MD 05/14/11 1524

## 2011-05-14 NOTE — ED Notes (Signed)
Patient reports she had throat surgery here on 04-19.  She has had nausea since her surgery.  She has been taking phenergan for nausea.  Patient states she has not had a bm since before her surgery.  She abd cramping today

## 2011-09-04 ENCOUNTER — Other Ambulatory Visit: Payer: Self-pay | Admitting: Obstetrics and Gynecology

## 2011-10-28 ENCOUNTER — Ambulatory Visit (INDEPENDENT_AMBULATORY_CARE_PROVIDER_SITE_OTHER): Payer: 59 | Admitting: Obstetrics and Gynecology

## 2011-10-28 DIAGNOSIS — Z331 Pregnant state, incidental: Secondary | ICD-10-CM

## 2011-10-28 LAB — POCT URINALYSIS DIPSTICK
Blood, UA: NEGATIVE
Glucose, UA: NEGATIVE
Nitrite, UA: NEGATIVE
Urobilinogen, UA: NEGATIVE

## 2011-10-28 NOTE — Progress Notes (Signed)
NOB INTERVIEW 

## 2011-10-29 LAB — PRENATAL PANEL VII
HCT: 38.8 % (ref 36.0–46.0)
HIV: NONREACTIVE
Hemoglobin: 13.3 g/dL (ref 12.0–15.0)
Lymphocytes Relative: 24 % (ref 12–46)
Lymphs Abs: 2.1 10*3/uL (ref 0.7–4.0)
MCV: 92.4 fL (ref 78.0–100.0)
Monocytes Absolute: 0.6 10*3/uL (ref 0.1–1.0)
Monocytes Relative: 7 % (ref 3–12)
Neutro Abs: 6.1 10*3/uL (ref 1.7–7.7)
Rh Type: POSITIVE
Rubella: 261.9 IU/mL — ABNORMAL HIGH
WBC: 8.9 10*3/uL (ref 4.0–10.5)

## 2011-10-30 LAB — CULTURE, OB URINE
Colony Count: NO GROWTH
Organism ID, Bacteria: NO GROWTH

## 2011-10-30 LAB — HEMOGLOBINOPATHY EVALUATION: Hgb F Quant: 0 % (ref 0.0–2.0)

## 2011-11-06 ENCOUNTER — Ambulatory Visit (INDEPENDENT_AMBULATORY_CARE_PROVIDER_SITE_OTHER): Payer: 59

## 2011-11-06 ENCOUNTER — Encounter: Payer: Self-pay | Admitting: Obstetrics and Gynecology

## 2011-11-06 ENCOUNTER — Ambulatory Visit (INDEPENDENT_AMBULATORY_CARE_PROVIDER_SITE_OTHER): Payer: 59 | Admitting: Obstetrics and Gynecology

## 2011-11-06 VITALS — BP 90/52 | Wt 135.0 lb

## 2011-11-06 DIAGNOSIS — Z331 Pregnant state, incidental: Secondary | ICD-10-CM | POA: Insufficient documentation

## 2011-11-06 DIAGNOSIS — O368390 Maternal care for abnormalities of the fetal heart rate or rhythm, unspecified trimester, not applicable or unspecified: Secondary | ICD-10-CM

## 2011-11-06 HISTORY — DX: Pregnant state, incidental: Z33.1

## 2011-11-06 LAB — US OB COMP LESS 14 WKS

## 2011-11-06 NOTE — Progress Notes (Signed)
[redacted]w[redacted]d Declines genetic screenings  Flu shot given today w/o difficulty

## 2011-11-07 NOTE — Progress Notes (Signed)
Patient ID: Morgan Day, female   DOB: [birth year], 26 y.o.   MRN: [id number] Morgan Day is a 26 y.o. female presenting for new ob visit. No birth control at conception. Certain of LMP 08/14/2011. [place] to day for Fhts with EDC change to 05/24/2011. @MED  @IPILAPH @ OB History    Grav Para Term Preterm Abortions TAB SAB Ect Mult Living   2 1 1       1     SVD uncomplicated 9#  Past Medical History  Diagnosis Date  . Asthma     exercise asthma  . Urinary reflux     as child  . PONV (postoperative nausea and vomiting)   . Depression AS  TEEN  . Anxiety     DR CYNTHIA WHITE FOR MED  . Infection     FREQUENT UTI A CHILD  . Chronic kidney disease AGE 81    REFLUX   Past Surgical History  Procedure Date  . Urinary surgery     for reflux   as child  . Mouth surgery   . Thyroglossal duct cyst 05/09/2011  . Thyroglossal duct cyst 05/09/2011    Procedure: THYROGLOSSAL DUCT CYST;  Surgeon: Osborn Coho, MD;  Location: [facility] OR;  Service: ENT;  Laterality: N/A;   Family History: family history includes Other in her mother and sister. Social History:  reports that she quit smoking about 6 weeks ago. Her smoking use included Cigarettes. She has a 4 pack-year smoking history. She quit smokeless tobacco use about 6 weeks ago. She reports that she drinks alcohol. She reports that she does not use illicit drugs.    Blood pressure 90/52, weight 135 lb (61.236 kg), last menstrual period 08/24/2011. Physical exam: pending transfer of records Pelvis proven to 9#0 Prenatal labs: ABO, Rh: O/POS/-- (10/08 1129) Antibody: NEG (10/08 1129) Rubella:  Immune RPR: NON REAC (10/08 1129)  HBsAg: NEGATIVE (10/08 1129)  HIV: NON REACTIVE (10/08 1129)    Assessment/Plan: [redacted]w[redacted]d ULTRASOUND plan for 20 week anatomy Genetic testing discussed and declined. Transfer of records pending from 8/15 PAP, GC/CHL. Collaboration with Dr. Vanita Panda, [facility] 11/07/2011, 2:02 PM Lavera Guise, CNM

## 2011-11-21 ENCOUNTER — Ambulatory Visit: Payer: 59 | Admitting: Obstetrics and Gynecology

## 2011-11-21 ENCOUNTER — Ambulatory Visit (INDEPENDENT_AMBULATORY_CARE_PROVIDER_SITE_OTHER): Payer: 59 | Admitting: Obstetrics and Gynecology

## 2011-11-21 VITALS — BP 98/58 | Wt 135.0 lb

## 2011-11-21 DIAGNOSIS — Z331 Pregnant state, incidental: Secondary | ICD-10-CM

## 2011-11-21 NOTE — Progress Notes (Signed)
Pt stated no issues today.  

## 2011-11-21 NOTE — Progress Notes (Signed)
Patient ID: Morgan Day, female   DOB: 10-Feb-1985, 25 y.o.   MRN: [id number] [redacted]w[redacted]d Declines gentic testing GC/CHL neg/neg PAP WNL Lavera Guise, CNM

## 2011-12-17 ENCOUNTER — Ambulatory Visit (INDEPENDENT_AMBULATORY_CARE_PROVIDER_SITE_OTHER): Payer: 59 | Admitting: Obstetrics and Gynecology

## 2011-12-17 VITALS — BP 98/52 | Wt 138.0 lb

## 2011-12-17 DIAGNOSIS — F419 Anxiety disorder, unspecified: Secondary | ICD-10-CM

## 2011-12-17 DIAGNOSIS — R270 Ataxia, unspecified: Secondary | ICD-10-CM | POA: Insufficient documentation

## 2011-12-17 DIAGNOSIS — R279 Unspecified lack of coordination: Secondary | ICD-10-CM

## 2011-12-17 DIAGNOSIS — IMO0002 Reserved for concepts with insufficient information to code with codable children: Secondary | ICD-10-CM | POA: Insufficient documentation

## 2011-12-17 DIAGNOSIS — F411 Generalized anxiety disorder: Secondary | ICD-10-CM

## 2011-12-17 HISTORY — DX: Anxiety disorder, unspecified: F41.9

## 2011-12-17 HISTORY — DX: Ataxia, unspecified: R27.0

## 2011-12-17 NOTE — Progress Notes (Signed)
Doing well. [place] NV for anatomy Glucola NV due to hx LGA infant. Declined genetic testing. Hopes for unmedicated birth.  May be interested in waterbirth--referred to class at [facility].

## 2011-12-17 NOTE — Progress Notes (Signed)
Pt stated no issues today.  

## 2011-12-29 ENCOUNTER — Encounter: Payer: 59 | Admitting: Obstetrics and Gynecology

## 2012-01-06 ENCOUNTER — Other Ambulatory Visit: Payer: 59

## 2012-01-06 ENCOUNTER — Other Ambulatory Visit: Payer: Self-pay

## 2012-01-06 ENCOUNTER — Ambulatory Visit (INDEPENDENT_AMBULATORY_CARE_PROVIDER_SITE_OTHER): Payer: 59 | Admitting: Obstetrics and Gynecology

## 2012-01-06 ENCOUNTER — Ambulatory Visit (INDEPENDENT_AMBULATORY_CARE_PROVIDER_SITE_OTHER): Payer: 59

## 2012-01-06 ENCOUNTER — Encounter: Payer: 59 | Admitting: Obstetrics and Gynecology

## 2012-01-06 VITALS — BP 110/64 | Wt 143.0 lb

## 2012-01-06 DIAGNOSIS — Z331 Pregnant state, incidental: Secondary | ICD-10-CM

## 2012-01-06 DIAGNOSIS — Z3490 Encounter for supervision of normal pregnancy, unspecified, unspecified trimester: Secondary | ICD-10-CM

## 2012-01-06 DIAGNOSIS — Z3689 Encounter for other specified antenatal screening: Secondary | ICD-10-CM

## 2012-01-06 LAB — CBC
Hemoglobin: 12.2 g/dL (ref 12.0–15.0)
MCH: 32.6 pg (ref 26.0–34.0)
MCHC: 35.1 g/dL (ref 30.0–36.0)
RDW: 13.5 % (ref 11.5–15.5)

## 2012-01-06 NOTE — Progress Notes (Signed)
[redacted]w[redacted]d Anatomy scan - c/w dates, 3V cord, cvx 4.0cm. No anomalies, nl fluid, ant placenta, Nl bil ovaries No complaints except feels lt headed occas, will check cbc with glucola today Precautions reviewed RTO 4wks

## 2012-01-07 LAB — GLUCOSE TOLERANCE, 1 HOUR (50G) W/O FASTING: Glucose, 1 Hour GTT: 104 mg/dL (ref 70–140)

## 2012-01-09 LAB — US OB COMP + 14 WK

## 2012-01-20 ENCOUNTER — Telehealth: Payer: Self-pay

## 2012-01-20 NOTE — Telephone Encounter (Signed)
LM for pt to cb re: test results. Direct ext. Given. Nuchem Grattan, Jacqueline A  

## 2012-01-22 ENCOUNTER — Telehealth: Payer: Self-pay

## 2012-01-22 NOTE — Telephone Encounter (Signed)
Spoke to pt to let her know lab results. I encouraged her to eat iron rich foods and to remember to take PNV q d, as HCT was a little low. She understands. Melody Comas A

## 2012-02-10 ENCOUNTER — Encounter: Payer: Self-pay | Admitting: Obstetrics and Gynecology

## 2012-02-10 ENCOUNTER — Ambulatory Visit: Payer: 59 | Admitting: Obstetrics and Gynecology

## 2012-02-10 VITALS — BP 106/60 | Wt 146.0 lb

## 2012-02-10 DIAGNOSIS — Z3490 Encounter for supervision of normal pregnancy, unspecified, unspecified trimester: Secondary | ICD-10-CM

## 2012-02-10 NOTE — Progress Notes (Signed)
[redacted]w[redacted]d

## 2012-02-10 NOTE — Progress Notes (Signed)
Doing well. No issues. Glucola, Hgb, RPR NV. Reviewed early glucola.

## 2012-03-09 ENCOUNTER — Encounter: Payer: 59 | Admitting: Obstetrics and Gynecology

## 2012-03-09 ENCOUNTER — Telehealth: Payer: Self-pay

## 2012-03-09 ENCOUNTER — Other Ambulatory Visit: Payer: 59

## 2012-03-09 ENCOUNTER — Telehealth: Payer: Self-pay | Admitting: Obstetrics and Gynecology

## 2012-03-09 NOTE — Telephone Encounter (Signed)
Spoke to pt who c/o N/V since last night to the point of not being able to sleep or keep anything down. I recommended the BRAT diet. She report good FM, No ctx, bleeding or loss of fluid. Pt actually has has appt today with VL and also is having her 1 Hr GTT today. I spoke to VL and she states she would not recommend coming to office today, if pt is so miserable w/ N/V, and that she would not try to do the 1 hr GTT today, as she probably wouldn't be able to keep it down. We will Rx Phenergan supps. 1  PR q6 prn # 20 No RF's To [facility].. Pt will R/S other appts. Melody Comas A

## 2012-03-09 NOTE — Telephone Encounter (Signed)
Called Phenergan supps 25 mg sig 1 PR q 6 hrs prn # 20 No RF's to [facility]. Per VL. Melody Comas A

## 2012-03-16 ENCOUNTER — Other Ambulatory Visit: Payer: 59

## 2012-03-16 ENCOUNTER — Ambulatory Visit: Payer: 59 | Admitting: Obstetrics and Gynecology

## 2012-03-16 ENCOUNTER — Encounter: Payer: Self-pay | Admitting: Obstetrics and Gynecology

## 2012-03-16 VITALS — BP 120/58 | Wt 156.0 lb

## 2012-03-16 DIAGNOSIS — Z331 Pregnant state, incidental: Secondary | ICD-10-CM

## 2012-03-16 LAB — CBC
MCH: 34.1 pg — ABNORMAL HIGH (ref 26.0–34.0)
MCV: 95.6 fL (ref 78.0–100.0)
Platelets: 228 10*3/uL (ref 150–400)
RDW: 14.1 % (ref 11.5–15.5)
WBC: 8.8 10*3/uL (ref 4.0–10.5)

## 2012-03-16 NOTE — Progress Notes (Signed)
[redacted]w[redacted]d Glucola today  Pt has no complaints

## 2012-03-16 NOTE — Progress Notes (Signed)
[redacted]w[redacted]d  GFM S>D with previous delivery of 9 lbs 3 oz: Plan ultrasound at 37-38 weeks

## 2012-03-17 LAB — RPR

## 2012-03-29 ENCOUNTER — Ambulatory Visit: Payer: 59 | Admitting: Certified Nurse Midwife

## 2012-03-29 VITALS — BP 100/56 | Wt 156.0 lb

## 2012-03-29 DIAGNOSIS — Z331 Pregnant state, incidental: Secondary | ICD-10-CM

## 2012-03-29 NOTE — Progress Notes (Signed)
[redacted]w[redacted]d Pt feeling well, occasional BH. GFM Per SR size was greater than dates and she rec U/S at 37-38 weeks.  Pt aware. Today S=D; Rv'd second 1hr glucola = 88.

## 2012-03-29 NOTE — Progress Notes (Signed)
Pt stated no issues today.  

## 2012-09-01 ENCOUNTER — Encounter (HOSPITAL_COMMUNITY): Payer: Self-pay | Admitting: *Deleted

## 2013-11-21 ENCOUNTER — Encounter (HOSPITAL_COMMUNITY): Payer: Self-pay | Admitting: *Deleted

## 2013-12-21 ENCOUNTER — Emergency Department (HOSPITAL_BASED_OUTPATIENT_CLINIC_OR_DEPARTMENT_OTHER)
Admission: EM | Admit: 2013-12-21 | Discharge: 2013-12-21 | Disposition: A | Discharge status: Home / Self Care | Payer: 59 | Attending: Emergency Medicine | Admitting: Emergency Medicine

## 2013-12-21 ENCOUNTER — Encounter (HOSPITAL_BASED_OUTPATIENT_CLINIC_OR_DEPARTMENT_OTHER): Payer: Self-pay | Admitting: *Deleted

## 2013-12-21 DIAGNOSIS — R55 Syncope and collapse: Secondary | ICD-10-CM | POA: Insufficient documentation

## 2013-12-21 DIAGNOSIS — Z3202 Encounter for pregnancy test, result negative: Secondary | ICD-10-CM | POA: Insufficient documentation

## 2013-12-21 DIAGNOSIS — J45909 Unspecified asthma, uncomplicated: Secondary | ICD-10-CM | POA: Diagnosis not present

## 2013-12-21 DIAGNOSIS — Z720 Tobacco use: Secondary | ICD-10-CM | POA: Insufficient documentation

## 2013-12-21 HISTORY — DX: Unspecified asthma, uncomplicated: J45.909

## 2013-12-21 LAB — BASIC METABOLIC PANEL
Anion gap: 13 (ref 5–15)
BUN: 16 mg/dL (ref 6–23)
CO2: 24 mEq/L (ref 19–32)
Calcium: 9.6 mg/dL (ref 8.4–10.5)
Chloride: 102 mEq/L (ref 96–112)
Creatinine, Ser: 0.7 mg/dL (ref 0.50–1.10)
GFR calc Af Amer: >90 mL/min (ref >90)
GFR calc non Af Amer: >90 mL/min (ref >90)
Glucose, Bld: 91 mg/dL (ref 70–99)
Potassium: 3.9 mEq/L (ref 3.7–5.3)
Sodium: 139 mEq/L (ref 137–147)

## 2013-12-21 LAB — URINALYSIS, ROUTINE W REFLEX MICROSCOPIC
Bilirubin Urine: NEGATIVE
Glucose, UA: NEGATIVE mg/dL
Hgb urine dipstick: NEGATIVE
Ketones, ur: NEGATIVE mg/dL
Leukocytes, UA: NEGATIVE
Nitrite: NEGATIVE
Protein, ur: NEGATIVE mg/dL
Specific Gravity, Urine: 1.009 (ref 1.005–1.030)
Urobilinogen, UA: 0.2 mg/dL (ref 0.0–1.0)
pH: 6.5 (ref 5.0–8.0)

## 2013-12-21 LAB — CBC
HCT: 39.7 % (ref 36.0–46.0)
Hemoglobin: 14 g/dL (ref 12.0–15.0)
MCH: 32.2 pg (ref 26.0–34.0)
MCHC: 35.3 g/dL (ref 30.0–36.0)
MCV: 91.3 fL (ref 78.0–100.0)
Platelets: 282 10*3/uL (ref 150–400)
RBC: 4.35 MIL/uL (ref 3.87–5.11)
RDW: 12.5 % (ref 11.5–15.5)
WBC: 8 10*3/uL (ref 4.0–10.5)

## 2013-12-21 LAB — PREGNANCY, URINE: Preg Test, Ur: NEGATIVE

## 2013-12-21 NOTE — ED Notes (Signed)
Pt c/o syncopal episode lasting " few sec" witnessed by husband x 1 day ago

## 2013-12-21 NOTE — ED Notes (Addendum)
9 pm Last night pt fainted for a few seconds. Family reports lowering pt to floor, shaking in hands and head. Then placed pt in bed. Pt also reports having passing out spells but they are not consistent. Denies lose of bowels or bladder.

## 2013-12-21 NOTE — Discharge Instructions (Signed)
1. Medications: usual home medications 2. Treatment: rest, drink plenty of fluids,  3. Follow Up: Please followup with your primary doctor in 2 days for discussion of your diagnoses and further evaluation after today's visit; if you do not have a primary care doctor use the resource guide provided to find one; Please return to the ER for syncope without warning, numbness, weakness, other neurologic concerns   Syncope Syncope is a medical term for fainting or passing out. This means you lose consciousness and drop to the ground. People are generally unconscious for less than 5 minutes. You may have some muscle twitches for up to 15 seconds before waking up and returning to normal. Syncope occurs more often in older adults, but it can happen to anyone. While most causes of syncope are not dangerous, syncope can be a sign of a serious medical problem. It is important to seek medical care.  CAUSES  Syncope is caused by a sudden drop in blood flow to the brain. The specific cause is often not determined. Factors that can bring on syncope include:  Taking medicines that lower blood pressure.  Sudden changes in posture, such as standing up quickly.  Taking more medicine than prescribed.  Standing in one place for too long.  Seizure disorders.  Dehydration and excessive exposure to heat.  Low blood sugar (hypoglycemia).  Straining to have a bowel movement.  Heart disease, irregular heartbeat, or other circulatory problems.  Fear, emotional distress, seeing blood, or severe pain. SYMPTOMS  Right before fainting, you may:  Feel dizzy or light-headed.  Feel nauseous.  See all white or all black in your field of vision.  Have cold, clammy skin. DIAGNOSIS  Your health care provider will ask about your symptoms, perform a physical exam, and perform an electrocardiogram (ECG) to record the electrical activity of your heart. Your health care provider may also perform other heart or blood  tests to determine the cause of your syncope which may include:  Transthoracic echocardiogram (TTE). During echocardiography, sound waves are used to evaluate how blood flows through your heart.  Transesophageal echocardiogram (TEE).  Cardiac monitoring. This allows your health care provider to monitor your heart rate and rhythm in real time.  Holter monitor. This is a portable device that records your heartbeat and can help diagnose heart arrhythmias. It allows your health care provider to track your heart activity for several days, if needed.  Stress tests by exercise or by giving medicine that makes the heart beat faster. TREATMENT  In most cases, no treatment is needed. Depending on the cause of your syncope, your health care provider may recommend changing or stopping some of your medicines. HOME CARE INSTRUCTIONS  Have someone stay with you until you feel stable.  Do not drive, use machinery, or play sports until your health care provider says it is okay.  Keep all follow-up appointments as directed by your health care provider.  Lie down right away if you start feeling like you might faint. Breathe deeply and steadily. Wait until all the symptoms have passed.  Drink enough fluids to keep your urine clear or pale yellow.  If you are taking blood pressure or heart medicine, get up slowly and take several minutes to sit and then stand. This can reduce dizziness. SEEK IMMEDIATE MEDICAL CARE IF:   You have a severe headache.  You have unusual pain in the chest, abdomen, or back.  You are bleeding from your mouth or rectum, or you have black or  tarry stool.  You have an irregular or very fast heartbeat.  You have pain with breathing.  You have repeated fainting or seizure-like jerking during an episode.  You faint when sitting or lying down.  You have confusion.  You have trouble walking.  You have severe weakness.  You have vision problems. If you fainted, call your  local emergency services (911 in [place] Do not drive yourself to the hospital.  MAKE SURE YOU:  Understand these instructions.  Will watch your condition.  Will get help right away if you are not doing well or get worse. Document Released: 01/06/2005 Document Revised: 01/11/2013 Document Reviewed: 03/07/2011 [facility] Patient Information 2015 [place], [place]. This information is not intended to replace advice given to you by your health care provider. Make sure you discuss any questions you have with your health care provider.

## 2013-12-21 NOTE — ED Provider Notes (Signed)
CSN: [id number]     Arrival date & time 12/21/13  1648 History   First MD Initiated Contact with Patient 12/21/13 1738     Chief Complaint  Patient presents with  . Loss of Consciousness     (Consider location/radiation/quality/duration/timing/severity/associated sxs/prior Treatment) Patient is a 28 y.o. female presenting with syncope. The history is provided by the patient and medical records. No language interpreter was used.  Loss of Consciousness Associated symptoms: no chest pain, no diaphoresis, no fever, no headaches, no nausea, no shortness of breath and no vomiting      Morgan Day is a 28 y.o. female  with a hx of asthma presents to the Emergency Department complaining of acute episode of syncope onset yesterday evening.  Pt fell, but did not hit her head as her husband caught her.  She describes a prodrome with weakness and feeling hot/cold.  Pt reports similar episodes in the past of near syncope, but no full LOC.  Pt reports she has never been fully evaluated.  Pt reports normal blood work 6 mos ago.  She has no Hx of anemia or cardiac problems.  Pt reports that each episode in the past was with standing and last night's episode was while standing as well/  Pt denies loss of bowel or bladder control during the incident and witnesses deny seizure like activity.  No episodes today and she feels normal.  Pt denies fever, chills, headache, neck pain, chest pain, SOB, abd pain, N/V/D, weakness, dysuria, hematuria.  LMP: 12/18/13.     Past Medical History  Diagnosis Date  . Asthma    History reviewed. No pertinent past surgical history. History reviewed. No pertinent family history. History  Substance Use Topics  . Smoking status: Current Some Day Smoker  . Smokeless tobacco: Not on file  . Alcohol Use: No   OB History    No data available     Review of Systems  Constitutional: Negative for fever, diaphoresis, appetite change, fatigue and unexpected weight change.   HENT: Negative for mouth sores.   Eyes: Negative for visual disturbance.  Respiratory: Negative for cough, chest tightness, shortness of breath and wheezing.   Cardiovascular: Positive for syncope. Negative for chest pain.  Gastrointestinal: Negative for nausea, vomiting, abdominal pain, diarrhea and constipation.  Endocrine: Negative for polydipsia, polyphagia and polyuria.  Genitourinary: Negative for dysuria, urgency, frequency and hematuria.  Musculoskeletal: Negative for back pain and neck stiffness.  Skin: Negative for rash.  Allergic/Immunologic: Negative for immunocompromised state.  Neurological: Positive for syncope. Negative for light-headedness and headaches.  Hematological: Does not bruise/bleed easily.  Psychiatric/Behavioral: Negative for sleep disturbance. The patient is not nervous/anxious.       Allergies  Review of patient's allergies indicates no known allergies.  Home Medications   Prior to Admission medications   Not on File   BP 134/83 mmHg  Pulse 85  Temp(Src) 98.4 F (36.9 C) (Oral)  Resp 18  Ht 5\' 9"  (1.753 m)  Wt 124 lb (56.246 kg)  BMI 18.30 kg/m2  SpO2 98%  LMP 12/18/2013 Physical Exam  Constitutional: She is oriented to person, place, and time. She appears well-developed and well-nourished. No distress.  Awake, alert, nontoxic appearance  HENT:  Head: Normocephalic and atraumatic.  Mouth/Throat: Oropharynx is clear and moist. No oropharyngeal exudate.  Eyes: Conjunctivae and EOM are normal. Pupils are equal, round, and reactive to light. No scleral icterus.  No horizontal, vertical or rotational nystagmus  Neck: Normal range of motion. Neck  supple.  Full active and passive ROM without pain No midline or paraspinal tenderness No nuchal rigidity or meningeal signs  Cardiovascular: Normal rate, regular rhythm, normal heart sounds and intact distal pulses.   Pulmonary/Chest: Effort normal and breath sounds normal. No respiratory distress.  She has no wheezes. She has no rales.  Equal chest expansion  Abdominal: Soft. Bowel sounds are normal. She exhibits no mass. There is no tenderness. There is no rebound and no guarding.  Musculoskeletal: Normal range of motion. She exhibits no edema.  Lymphadenopathy:    She has no cervical adenopathy.  Neurological: She is alert and oriented to person, place, and time. She has normal reflexes. No cranial nerve deficit. She exhibits normal muscle tone. Coordination normal.  Mental Status:  Alert, oriented, thought content appropriate. Speech fluent without evidence of aphasia. Able to follow 2 step commands without difficulty.  Cranial Nerves:  II:  Peripheral visual fields grossly normal, pupils equal, round, reactive to light III,IV, VI: ptosis not present, extra-ocular motions intact bilaterally  V,VII: smile symmetric, facial light touch sensation equal VIII: hearing grossly normal bilaterally  IX,X: gag reflex present  XI: bilateral shoulder shrug equal and strong XII: midline tongue extension  Motor:  5/5 in upper and lower extremities bilaterally including strong and equal grip strength and dorsiflexion/plantar flexion Sensory: Pinprick and light touch normal in all extremities.  Deep Tendon Reflexes: 2+ and symmetric  Cerebellar: normal finger-to-nose with bilateral upper extremities Gait: normal gait and balance CV: distal pulses palpable throughout   Skin: Skin is warm and dry. No rash noted. She is not diaphoretic. No erythema.  Psychiatric: She has a normal mood and affect. Her behavior is normal. Judgment and thought content normal.  Nursing note and vitals reviewed.   ED Course  Procedures (including critical care time) Labs Review Labs Reviewed  PREGNANCY, URINE  CBC  BASIC METABOLIC PANEL  URINALYSIS, ROUTINE W REFLEX MICROSCOPIC    Imaging Review No results found.   EKG Interpretation   Date/Time:  Wednesday December 21 2013 18:04:20 EST Ventricular  Rate:  94 PR Interval:  128 QRS Duration: 72 QT Interval:  372 QTC Calculation: 465 R Axis:   85 Text Interpretation:  Normal sinus rhythm Normal ECG Confirmed by DELOS   MD, DOUGLAS ([id number]) on 12/21/2013 7:19:56 PM         MDM   Final diagnoses:  Syncope and collapse   Morgan MediateJennifer McAdams Day presents with syncope. Hx of near syncope.  No cardiac Hx.  Will assess for anemia, cardiac arrhythmia, electrolyte disturbance, UTI and pregnancy.  7:35PM Patient without arrhythmia or tachycardia while here in the department.  Patient without history of congestive heart failure, normal hematocrit, normal ECG, no shortness of breath and systolic blood pressure greater than 90; patient is low risk. Will plan for discharge home with close cardiology and PCP follow-up.  Possibility of recurrent syncope has been discussed. I discussed reasons to avoid driving until cardiology followup and other safety preventions including use of ladders and working at heights.   Pt has remained hemodynamically stable throughout their time in the ED  BP 134/83 mmHg  Pulse 85  Temp(Src) 98.4 F (36.9 C) (Oral)  Resp 18  Ht 5\' 9"  (1.753 m)  Wt 124 lb (56.246 kg)  BMI 18.30 kg/m2  SpO2 98%  LMP 12/18/2013    Morgan ClientHannah Marice Angelino, PA-C 12/21/13 2157  Geoffery Lyonsouglas Delo, MD 12/21/13 [phone]

## 2014-03-04 ENCOUNTER — Emergency Department (HOSPITAL_BASED_OUTPATIENT_CLINIC_OR_DEPARTMENT_OTHER): Payer: 59

## 2014-03-04 ENCOUNTER — Encounter (HOSPITAL_BASED_OUTPATIENT_CLINIC_OR_DEPARTMENT_OTHER): Payer: Self-pay | Admitting: *Deleted

## 2014-03-04 ENCOUNTER — Emergency Department (HOSPITAL_BASED_OUTPATIENT_CLINIC_OR_DEPARTMENT_OTHER)
Admission: EM | Admit: 2014-03-04 | Discharge: 2014-03-04 | Disposition: A | Discharge status: Home / Self Care | Payer: 59 | Attending: Emergency Medicine | Admitting: Emergency Medicine

## 2014-03-04 DIAGNOSIS — H547 Unspecified visual loss: Secondary | ICD-10-CM

## 2014-03-04 DIAGNOSIS — G43809 Other migraine, not intractable, without status migrainosus: Secondary | ICD-10-CM

## 2014-03-04 DIAGNOSIS — Z8659 Personal history of other mental and behavioral disorders: Secondary | ICD-10-CM | POA: Insufficient documentation

## 2014-03-04 DIAGNOSIS — Z87440 Personal history of urinary (tract) infections: Secondary | ICD-10-CM | POA: Insufficient documentation

## 2014-03-04 DIAGNOSIS — H5460 Unqualified visual loss, one eye, unspecified: Secondary | ICD-10-CM | POA: Diagnosis not present

## 2014-03-04 DIAGNOSIS — J45909 Unspecified asthma, uncomplicated: Secondary | ICD-10-CM | POA: Diagnosis not present

## 2014-03-04 DIAGNOSIS — H578 Other specified disorders of eye and adnexa: Secondary | ICD-10-CM | POA: Diagnosis present

## 2014-03-04 DIAGNOSIS — Z79899 Other long term (current) drug therapy: Secondary | ICD-10-CM | POA: Diagnosis not present

## 2014-03-04 DIAGNOSIS — Z3202 Encounter for pregnancy test, result negative: Secondary | ICD-10-CM | POA: Insufficient documentation

## 2014-03-04 HISTORY — DX: Urinary tract infection, site not specified: N39.0

## 2014-03-04 LAB — URINE MICROSCOPIC-ADD ON

## 2014-03-04 LAB — URINALYSIS, ROUTINE W REFLEX MICROSCOPIC
Bilirubin Urine: NEGATIVE
Glucose, UA: NEGATIVE mg/dL
Hgb urine dipstick: NEGATIVE
Ketones, ur: NEGATIVE mg/dL
Nitrite: POSITIVE — AB
Protein, ur: NEGATIVE mg/dL
Specific Gravity, Urine: 1.011 (ref 1.005–1.030)
Urobilinogen, UA: 0.2 mg/dL (ref 0.0–1.0)
pH: 7 (ref 5.0–8.0)

## 2014-03-04 LAB — PREGNANCY, URINE: Preg Test, Ur: NEGATIVE

## 2014-03-04 MED ORDER — GADOBENATE DIMEGLUMINE 529 MG/ML IV SOLN
10.0000 mL | Freq: Once | INTRAVENOUS | Status: AC
  Administered 2014-03-04: 10 mL via INTRAVENOUS

## 2014-03-04 MED ORDER — NITROFURANTOIN MONOHYD MACRO 100 MG PO CAPS: 100.0000 mg | ORAL_CAPSULE | Freq: Two times a day (BID) | ORAL | Status: DC

## 2014-03-04 MED ORDER — SUMATRIPTAN SUCCINATE 25 MG PO TABS: 100.0000 mg | ORAL_TABLET | Freq: Once | ORAL | Status: DC

## 2014-03-04 NOTE — ED Notes (Addendum)
Pt reports 2 episodes of partial vision loss since yesterday- sx resolved at present- also reports she may have UTI

## 2014-03-04 NOTE — ED Provider Notes (Signed)
CSN: [id number]     Arrival date & time 03/04/14  1339 History   First MD Initiated Contact with Patient 03/04/14 1343     Chief Complaint  Patient presents with  . Visual Field Change     (Consider location/radiation/quality/duration/timing/severity/associated sxs/prior Treatment) HPI Comments: 29 year old female with family history of cerebellar ataxia, smoker presents after 2 episodes of significant vision loss. Yesterday patient had an episode lasting 45 minutes where she saw bright lights flashes and minimal vision in her right eye the last impression he 45 minutes and resolved completely. She had no other neuro complaints or deficits at that time. She has never had this in the past. Patient had migraines with mild vision issues when she was younger however this is different. Today patient also had another brief episode of loss of vision and squiggly lines in her left eye that resolved earlier today. Patient has currently no symptoms or signs. Patient has no vascular risk factors except for smoking. Her symptoms improved with time  The history is provided by the patient.    Past Medical History  Diagnosis Date  . Asthma   . Anxiety   . Frequent UTI    Past Surgical History  Procedure Laterality Date  . Neck surgery     No family history on file. History  Substance Use Topics  . Smoking status: Current Some Day Smoker    Types: Cigarettes  . Smokeless tobacco: Not on file  . Alcohol Use: Yes     Comment: social   OB History    No data available     Review of Systems  Constitutional: Negative for fever and chills.  HENT: Negative for congestion.   Eyes: Positive for photophobia and visual disturbance.  Respiratory: Negative for shortness of breath.   Cardiovascular: Negative for chest pain.  Gastrointestinal: Negative for vomiting and abdominal pain.  Genitourinary: Negative for dysuria and flank pain.  Musculoskeletal: Negative for back pain, gait problem, neck pain  and neck stiffness.  Skin: Negative for rash.  Neurological: Negative for weakness, light-headedness, numbness and headaches.      Allergies  Review of patient's allergies indicates no known allergies.  Home Medications   Prior to Admission medications   Medication Sig Start Date End Date Taking? Authorizing Provider  levonorgestrel-ethinyl estradiol (AVIANE,ALESSE,LESSINA) 0.1-20 MG-MCG tablet Take 1 tablet by mouth daily.   Yes Historical Provider, MD  nitrofurantoin, macrocrystal-monohydrate, (MACROBID) 100 MG capsule Take 1 capsule (100 mg total) by mouth 2 (two) times daily. 03/04/14   Rolland Porter, MD  SUMAtriptan (IMITREX) 25 MG tablet Take 4 tablets (100 mg total) by mouth once. May repeat in 2 hours if headache persists or recurs. 03/04/14   Rolland Porter, MD   BP 136/97 mmHg  Pulse 90  Temp(Src) 98.1 F (36.7 C) (Oral)  Resp 16  Ht  (1.753 m)  Wt 120 lb (54.432 kg)  BMI 17.71 kg/m2  SpO2 100%  LMP 02/05/2014 Physical Exam  Constitutional: She is oriented to person, place, and time. She appears well-developed and well-nourished.  HENT:  Head: Normocephalic and atraumatic.  Eyes: Conjunctivae are normal. Right eye exhibits no discharge. Left eye exhibits no discharge.  Neck: Normal range of motion. Neck supple. No tracheal deviation present.  Cardiovascular: Normal rate and regular rhythm.   Pulmonary/Chest: Effort normal and breath sounds normal.  Abdominal: Soft. She exhibits no distension. There is no tenderness. There is no guarding.  Musculoskeletal: She exhibits no edema.  Neurological: She is alert  and oriented to person, place, and time. Coordination and gait normal. GCS eye subscore is 4. GCS verbal subscore is 5. GCS motor subscore is 6.  5+ strength in UE and LE with f/e at major joints. Sensation to palpation intact in UE and LE. CNs 2-12 grossly intact.  EOMFI.  PERRL.   Finger nose and coordination intact bilateral.   Visual fields intact to finger  testing.   Skin: Skin is warm. No rash noted.  Psychiatric: She has a normal mood and affect.  Nursing note and vitals reviewed.   ED Course  Procedures (including critical care time) Labs Review Labs Reviewed  URINALYSIS, ROUTINE W REFLEX MICROSCOPIC - Abnormal; Notable for the following:    APPearance CLOUDY (*)    Nitrite POSITIVE (*)    Leukocytes, UA LARGE (*)    All other components within normal limits  URINE MICROSCOPIC-ADD ON - Abnormal; Notable for the following:    Bacteria, UA MANY (*)    All other components within normal limits  URINE CULTURE  PREGNANCY, URINE    Imaging Review No results found. Mr Laqueta JeanBrain W Wo Contrast  03/04/2014   CLINICAL DATA:  Severe pressure above the RIGHT eye with visual field loss. History of syncopal episodes.  EXAM: MRI HEAD WITHOUT AND WITH CONTRAST  TECHNIQUE: Multiplanar, multiecho pulse sequences of the brain and surrounding structures were obtained without and with intravenous contrast.  CONTRAST:  MultiHance 10 mL.  COMPARISON:  None.  FINDINGS: No evidence for acute infarction, hemorrhage, mass lesion, hydrocephalus, or extra-axial fluid. Normal cerebral volume. No white matter disease.  Flow voids are maintained throughout the carotid, basilar, and vertebral arteries. There are no areas of chronic hemorrhage. Pituitary, pineal, and cerebellar tonsils unremarkable. No upper cervical lesions.  Post infusion, no abnormal enhancement of the brain or meninges. Major dural venous sinuses are patent. Extracranial soft tissues are unremarkable.  IMPRESSION: Negative exam.  No acute or focal intracranial abnormality.  No visible enhancing lesion or areas of restricted diffusion.  No white matter abnormalities are evident.  Within limits of detection on routine brain MR, no orbital findings.   Electronically Signed   By: Davonna BellingJohn  Curnes M.D.   On: 03/04/2014 16:02     EKG Interpretation None      MDM   Final diagnoses:  Vision loss  Other  migraine without status migrainosus, not intractable   Patient presents after 2 episodes of vision loss and changing colorful lights/flashes. Bedside ultrasound no significant retinal detachment in either eye. Patient has normal neuro exam, normal vision and no symptoms currently. Discussed differential diagnosis including migraines, MS, TIA, other neurologic issue. Patient understands she has close follow-up with ophthalmology and primary Dr./neurology. MRI pending.  Filed Vitals:   03/04/14 1344 03/04/14 1550  BP: 155/93 136/97  Pulse: 103 90  Temp: 98.6 F (37 C) 98.1 F (36.7 C)  TempSrc: Oral Oral  Resp: 18 16  Height: 5\' 9"  (1.753 m)   Weight: 120 lb (54.432 kg)   SpO2: 100% 100%      Enid SkeensJoshua M Berman Grainger, MD 03/25/14 [phone]

## 2014-03-04 NOTE — ED Provider Notes (Signed)
Patient updated on her normal results reviewed remains asymptomatic. Given neurological follow-up. Prescription for when necessary Imitrex.  Rolland PorterMark Cali Hope, MD 03/04/14 [phone]

## 2014-03-04 NOTE — Discharge Instructions (Signed)
If you were given medicines take as directed.  If you are on coumadin or contraceptives realize their levels and effectiveness is altered by many different medicines.  If you have any reaction (rash, tongues swelling, other) to the medicines stop taking and see a physician.   Follow up with specialists Please follow up as directed and return to the ER or see a physician for new or worsening symptoms.  Thank you. Filed Vitals:   03/04/14 1344  BP: 155/93  Pulse: 103  Temp: 98.6 F (37 C)  TempSrc: Oral  Resp: 18  Height: 5\' 9"  (1.753 m)  Weight: 120 lb (54.432 kg)  SpO2: 100%    Migraine Headache A migraine headache is an intense, throbbing pain on one or both sides of your head. A migraine can last for 30 minutes to several hours. CAUSES  The exact cause of a migraine headache is not always known. However, a migraine may be caused when nerves in the brain become irritated and release chemicals that cause inflammation. This causes pain. Certain things may also trigger migraines, such as:  Alcohol.  Smoking.  Stress.  Menstruation.  Aged cheeses.  Foods or drinks that contain nitrates, glutamate, aspartame, or tyramine.  Lack of sleep.  Chocolate.  Caffeine.  Hunger.  Physical exertion.  Fatigue.  Medicines used to treat chest pain (nitroglycerine), birth control pills, estrogen, and some blood pressure medicines. SIGNS AND SYMPTOMS  Pain on one or both sides of your head.  Pulsating or throbbing pain.  Severe pain that prevents daily activities.  Pain that is aggravated by any physical activity.  Nausea, vomiting, or both.  Dizziness.  Pain with exposure to bright lights, loud noises, or activity.  General sensitivity to bright lights, loud noises, or smells. Before you get a migraine, you may get warning signs that a migraine is coming (aura). An aura may include:  Seeing flashing lights.  Seeing bright spots, halos, or zigzag lines.  Having  tunnel vision or blurred vision.  Having feelings of numbness or tingling.  Having trouble talking.  Having muscle weakness. DIAGNOSIS  A migraine headache is often diagnosed based on:  Symptoms.  Physical exam.  A CT scan or MRI of your head. These imaging tests cannot diagnose migraines, but they can help rule out other causes of headaches. TREATMENT Medicines may be given for pain and nausea. Medicines can also be given to help prevent recurrent migraines.  HOME CARE INSTRUCTIONS  Only take over-the-counter or prescription medicines for pain or discomfort as directed by your health care provider. The use of long-term narcotics is not recommended.  Lie down in a dark, quiet room when you have a migraine.  Keep a journal to find out what may trigger your migraine headaches. For example, write down:  What you eat and drink.  How much sleep you get.  Any change to your diet or medicines.  Limit alcohol consumption.  Quit smoking if you smoke.  Get 7-9 hours of sleep, or as recommended by your health care provider.  Limit stress.  Keep lights dim if bright lights bother you and make your migraines worse. SEEK IMMEDIATE MEDICAL CARE IF:   Your migraine becomes severe.  You have a fever.  You have a stiff neck.  You have vision loss.  You have muscular weakness or loss of muscle control.  You start losing your balance or have trouble walking.  You feel faint or pass out.  You have severe symptoms that are different  from your first symptoms. MAKE SURE YOU:   Understand these instructions.  Will watch your condition.  Will get help right away if you are not doing well or get worse. Document Released: 01/06/2005 Document Revised: 05/23/2013 Document Reviewed: 09/13/2012 [facility] Patient Information 2015 [place], [place]. This information is not intended to replace advice given to you by your health care provider. Make sure you discuss any questions you have  with your health care provider.

## 2014-03-07 LAB — URINE CULTURE: Colony Count: >=100000

## 2014-03-08 ENCOUNTER — Telehealth (HOSPITAL_BASED_OUTPATIENT_CLINIC_OR_DEPARTMENT_OTHER): Payer: Self-pay | Admitting: Emergency Medicine

## 2014-03-08 NOTE — Telephone Encounter (Signed)
Post ED Visit - Positive Culture Follow-up  Culture report reviewed by antimicrobial stewardship pharmacist: []  Wes Dulaney, Pharm.D., BCPS []  Celedonio MiyamotoJeremy Frens, Pharm.D., BCPS []  Georgina PillionElizabeth Martin, Pharm.D., BCPS []  AmesMinh Pham, [address]Pharm[address]D., BCPS, AAHIVP []  Estella HuskMichelle Turner, Pharm.D., BCPS, AAHIVP [x]  Elder CyphersLorie Poole, [address] BoulevardPharm.D., BCPS  Positive urine culture E. Coli Treated with nitrofurantoin, organism sensitive to the same and no further patient follow-up is required at this time.  Berle MullMiller, Zeynep Fantroy 03/08/2014, 11:22 AM

## 2014-06-26 ENCOUNTER — Encounter: Payer: Self-pay | Admitting: Medical

## 2014-06-26 ENCOUNTER — Ambulatory Visit (INDEPENDENT_AMBULATORY_CARE_PROVIDER_SITE_OTHER): Payer: 59 | Admitting: Medical

## 2014-06-26 VITALS — BP 143/96 | HR 106 | Temp 97.5°F | Ht 68.0 in | Wt 121.6 lb

## 2014-06-26 DIAGNOSIS — F411 Generalized anxiety disorder: Secondary | ICD-10-CM | POA: Insufficient documentation

## 2014-06-26 DIAGNOSIS — M25562 Pain in left knee: Secondary | ICD-10-CM

## 2014-06-26 DIAGNOSIS — G43809 Other migraine, not intractable, without status migrainosus: Secondary | ICD-10-CM

## 2014-06-26 DIAGNOSIS — K439 Ventral hernia without obstruction or gangrene: Secondary | ICD-10-CM | POA: Diagnosis not present

## 2014-06-26 DIAGNOSIS — Z87440 Personal history of urinary (tract) infections: Secondary | ICD-10-CM | POA: Insufficient documentation

## 2014-06-26 DIAGNOSIS — R55 Syncope and collapse: Secondary | ICD-10-CM

## 2014-06-26 DIAGNOSIS — G43909 Migraine, unspecified, not intractable, without status migrainosus: Secondary | ICD-10-CM | POA: Insufficient documentation

## 2014-06-26 HISTORY — DX: Pain in left knee: M25.562

## 2014-06-26 HISTORY — DX: Migraine, unspecified, not intractable, without status migrainosus: G43.909

## 2014-06-26 HISTORY — DX: Ventral hernia without obstruction or gangrene: K43.9

## 2014-06-26 HISTORY — DX: Personal history of urinary (tract) infections: Z87.440

## 2014-06-26 HISTORY — DX: Syncope and collapse: R55

## 2014-06-26 HISTORY — DX: Generalized anxiety disorder: F41.1

## 2014-06-26 MED ORDER — CLONAZEPAM 0.5 MG PO TABS: 0.5000 mg | ORAL_TABLET | Freq: Two times a day (BID) | ORAL | Status: DC | PRN

## 2014-06-26 MED ORDER — DICLOFENAC SODIUM 75 MG PO TBEC: 75.0000 mg | DELAYED_RELEASE_TABLET | Freq: Two times a day (BID) | ORAL | Status: DC

## 2014-06-26 NOTE — Assessment & Plan Note (Signed)
will need to come in for each event so we can get culture. Then may refer to urologist.

## 2014-06-26 NOTE — Patient Instructions (Addendum)
Anxiety- Rx limitied # of clonazapam. To use strategically and rare. If using frequent then rx ssri and would need to be put on contract and uds.(Note I would expect and hope #12 tabs would last 3-4 months).  Abdomen pain intermittent with probable hernia- Refer to surgeon. If pain changes and more severe then ED evaluation  Knee pain and some popliteal pain. She declines both xray and lower ext us. She wants referral to specialist. If any worse popliteal pain. If any swelling of calf or sob then ED evaluation.   Migrain hx- continue imitrex.   Hx of syncope for which never went to cardiology or neurology though advised.If recurrent syncope then ED evaluation. If she agrees in future to go will re-refer.  Uti hx of - will need to come in for each event so we can get culture. Then may refer to urologist.  Follow up in 1 Yeung or as needed.

## 2014-06-26 NOTE — Progress Notes (Signed)
Pre visit review using our clinic review tool, if applicable. No additional management support is needed unless otherwise documented below in the visit note. 

## 2014-06-26 NOTE — Assessment & Plan Note (Signed)
Anxiety- Rx limitied # of clonazapam. To use strategically and rare. If using frequent then rx ssri and would need to be put on contract and uds.(Note I would expect and hope #12 tabs would last 3-4 months)

## 2014-06-26 NOTE — Assessment & Plan Note (Signed)
Hx of syncope for which never went to cardiology or neurology though advised.If recurrent syncope then ED evaluation. If she agrees in future to go will re-refer.

## 2014-06-26 NOTE — Assessment & Plan Note (Signed)
Abdomen pain intermittent with probable hernia- Refer to surgeon. If pain changes and more severe then ED evaluation

## 2014-06-26 NOTE — Assessment & Plan Note (Signed)
Knee pain and some popliteal pain. She declines both xray and lower ext us. She wants referral to specialist. If any worse popliteal pain. If any swelling of calf or sob then ED evaluation.

## 2014-06-26 NOTE — Progress Notes (Signed)
Subjective:    Patient ID: Morgan Day, female    DOB: 01/09/1986, 29 y.o.   MRN: [id number]  HPI   Anxiety- Has hx of anxiety in past for many years. Pt was on for years in past. In past was on it more on chronic daily basis. Then stopped it for her last pregnancy. Then used for about 6 months and then stopped. Pt states in past it made her feel foggy/clouded thinking. So recently has not been on medication. But would want some clonazapam for periodic severe anxiety or panic attack.  Pt has has some exercise induced asthma when younger. But she has not hand any flares since younger since she does not exercise.  Depression- some treatment in high school.  Pt has hx of some reflux when she was very young. And also states that as adult she does have fairly frequent uti infections in the past. About 1 infection every 1 every 2-3 months.   Some left knee pain on and off for past 2 years. But also when younger playing sports had some pain. Now past 2 years more chronic daily.  Also midline slight buldge. It is slight painful on and off. No fever, no chills. No nausea or vomiting.   Hx of migraine ha in past. Pt states recently controlled with imitrex. Negative mri in February. ED referred her for one syncopal episode and neurologist. Pt did not go to either appointment.  LMP- Jun 19, 2014.    Review of Systems  Constitutional: Negative for fever, chills and fatigue.  Respiratory: Negative for cough, chest tightness, shortness of breath and wheezing.   Cardiovascular: Negative for chest pain and palpitations.  Gastrointestinal: Positive for abdominal pain. Negative for diarrhea and abdominal distention.       See hop  Genitourinary: Negative for dysuria, frequency, hematuria, flank pain, difficulty urinating, vaginal pain and pelvic pain.       Not currently.  Musculoskeletal: Negative for back pain.       Knee pain.  Neurological: Negative.        Not currently.    Hematological: Negative for adenopathy. Does not bruise/bleed easily.  Psychiatric/Behavioral: Negative for suicidal ideas, behavioral problems, confusion, self-injury, dysphoric mood, decreased concentration and agitation. The patient is nervous/anxious.    Past Medical History  Diagnosis Date  . Asthma   . Anxiety   . Frequent UTI   . Depression   . Chronic kidney disease     History   Social History  . Marital Status: Single    Spouse Name: N/A  . Number of Children: N/A  . Years of Education: N/A   Occupational History  . Not on file.   Social History Main Topics  . Smoking status: Current Some Day Smoker    Types: Cigarettes  . Smokeless tobacco: Not on file  . Alcohol Use: Yes     Comment: social  . Drug Use: Not on file  . Sexual Activity: No   Other Topics Concern  . Not on file   Social History Narrative    Past Surgical History  Procedure Laterality Date  . Neck surgery      Family History  Problem Relation Age of Onset  . Cancer Mother   . Ataxia Mother   . Depression Mother   . Anxiety disorder Mother     No Known Allergies  Current Outpatient Prescriptions on File Prior to Visit  Medication Sig Dispense Refill  . levonorgestrel-ethinyl estradiol (AVIANE,ALESSE,LESSINA) 0.1-20 MG-MCG  tablet Take 1 tablet by mouth daily.     No current facility-administered medications on file prior to visit.    BP 143/96 mmHg  Pulse 106  Temp(Src) 97.5 F (36.4 C) (Oral)  Ht  (1.727 m)  Wt 121 lb 9.6 oz (55.157 kg)  BMI 18.49 kg/m2  SpO2 100%  LMP 06/20/2013       Objective:   Physical Exam  General- No acute distress. Pleasant patient. Neck- Full range of motion, no jvd Lungs- Clear, even and unlabored. Heart- regular rate and rhythm. Neurologic- CNII- XII grossly intact. Abdomen- soft, non-tender, +bs, no rebound or guarding. Midline small bulge above umbilcus and that is very tender.  Lt knee- faint crepitus. No obvious on range  of motion. But palpation directly in popliteal fossae mild pain.  Lower ext- calves very thin and symmetric not swollen.      Assessment & Plan:

## 2014-06-26 NOTE — Assessment & Plan Note (Signed)
Migrain hx- continue imitrex.

## 2014-06-27 ENCOUNTER — Ambulatory Visit (INDEPENDENT_AMBULATORY_CARE_PROVIDER_SITE_OTHER): Payer: 59 | Admitting: Family Medicine

## 2014-06-27 ENCOUNTER — Ambulatory Visit (HOSPITAL_BASED_OUTPATIENT_CLINIC_OR_DEPARTMENT_OTHER)
Admission: RE | Admit: 2014-06-27 | Discharge: 2014-06-27 | Disposition: A | Discharge status: Home / Self Care | Payer: 59 | Source: Ambulatory Visit | Attending: Family Medicine | Admitting: Family Medicine

## 2014-06-27 ENCOUNTER — Encounter: Payer: Self-pay | Admitting: Family Medicine

## 2014-06-27 VITALS — BP 146/98 | HR 98 | Ht 68.0 in | Wt 121.0 lb

## 2014-06-27 DIAGNOSIS — M79605 Pain in left leg: Secondary | ICD-10-CM

## 2014-06-27 DIAGNOSIS — M25562 Pain in left knee: Secondary | ICD-10-CM | POA: Diagnosis not present

## 2014-06-28 NOTE — Progress Notes (Signed)
PCP and referred by: Esperanza RichtersSaguier, Edward, PA-C  Subjective:   HPI: Patient is a 29 y.o. female here for left knee pain.  Patient denies known injury. Recalls having pain for as long as 10 years dating back to when she played soccer. Pain posterior left knee more chronic past 3 months without increase in activity level. No swelling, bruising. Painful to straighten all the way then bend. Can get up to 8/10 level. No catching, locking, giving out.  Past Medical History  Diagnosis Date  . Asthma   . Anxiety   . Frequent UTI   . Depression   . Chronic kidney disease     Current Outpatient Prescriptions on File Prior to Visit  Medication Sig Dispense Refill  . clonazePAM (KLONOPIN) 0.5 MG tablet Take 1 tablet (0.5 mg total) by mouth 2 (two) times daily as needed for anxiety. 12 tablet 0  . diclofenac (VOLTAREN) 75 MG EC tablet Take 1 tablet (75 mg total) by mouth 2 (two) times daily. 30 tablet 0  . levonorgestrel-ethinyl estradiol (AVIANE,ALESSE,LESSINA) 0.1-20 MG-MCG tablet Take 1 tablet by mouth daily.    . SUMAtriptan (IMITREX) 100 MG tablet Take 100 mg by mouth every 2 (two) hours as needed for migraine. May repeat in 2 hours if headache persists or recurs.     No current facility-administered medications on file prior to visit.    Past Surgical History  Procedure Laterality Date  . Neck surgery      No Known Allergies  History   Social History  . Marital Status: Single    Spouse Name: N/A  . Number of Children: N/A  . Years of Education: N/A   Occupational History  . Not on file.   Social History Main Topics  . Smoking status: Current Some Day Smoker    Types: Cigarettes  . Smokeless tobacco: Not on file  . Alcohol Use: 0.0 oz/week    0 Standard drinks or equivalent per week     Comment: social  . Drug Use: Not on file  . Sexual Activity: No   Other Topics Concern  . Not on file   Social History Narrative    Family History  Problem Relation Age of Onset   . Cancer Mother   . Ataxia Mother   . Depression Mother   . Anxiety disorder Mother     BP 146/98 mmHg  Pulse 98  Ht 5\' 8"  (1.727 m)  Wt 121 lb (54.885 kg)  BMI 18.40 kg/m2  LMP 06/20/2013  Review of Systems: See HPI above.    Objective:  Physical Exam:  Gen: NAD  Left knee: No gross deformity, ecchymoses, swelling. TTP popliteal fossa.  No joint line, other tenderness. FROM with 5/5 strength, no pain knee flexion. Negative ant/post drawers. Negative valgus/varus testing. Negative lachmanns. Negative mcmurrays, apleys, patellar apprehension. NV intact distally.    Assessment & Plan:  1. Left knee pain - Doppler u/s done given location of pain, severity which was negative for DVT.  MSK u/s without evidence of baker's cyst.  Consistent with popliteus muscle strain/spasms.  Discussed nsaids, compression, icing.  Offered PT but she declined at this time - will let us know if she changes her mind.  Otherwise f/u prn.

## 2014-06-28 NOTE — Assessment & Plan Note (Signed)
Doppler u/s done given location of pain, severity which was negative for DVT.  MSK u/s without evidence of baker's cyst.  Consistent with popliteus muscle strain/spasms.  Discussed nsaids, compression, icing.  Offered PT but she declined at this time - will let us know if she changes her mind.  Otherwise f/u prn.

## 2014-07-26 ENCOUNTER — Ambulatory Visit (INDEPENDENT_AMBULATORY_CARE_PROVIDER_SITE_OTHER): Payer: 59 | Admitting: Medical

## 2014-07-26 ENCOUNTER — Encounter: Payer: Self-pay | Admitting: Medical

## 2014-07-26 VITALS — BP 110/75 | HR 86 | Temp 98.0°F | Ht 68.0 in | Wt 124.8 lb

## 2014-07-26 DIAGNOSIS — F411 Generalized anxiety disorder: Secondary | ICD-10-CM

## 2014-07-26 DIAGNOSIS — N390 Urinary tract infection, site not specified: Secondary | ICD-10-CM

## 2014-07-26 DIAGNOSIS — R350 Frequency of micturition: Secondary | ICD-10-CM | POA: Diagnosis not present

## 2014-07-26 HISTORY — DX: Urinary tract infection, site not specified: N39.0

## 2014-07-26 LAB — POCT URINALYSIS DIPSTICK
Bilirubin, UA: NEGATIVE
Glucose, UA: NEGATIVE
Ketones, UA: NEGATIVE
Nitrite, UA: POSITIVE
PH UA: 6
Protein, UA: 1.5
SPEC GRAV UA: 1.01
Urobilinogen, UA: 0.2

## 2014-07-26 MED ORDER — NITROFURANTOIN MONOHYD MACRO 100 MG PO CAPS: 100.0000 mg | ORAL_CAPSULE | Freq: Two times a day (BID) | ORAL | Status: DC

## 2014-07-26 NOTE — Progress Notes (Signed)
Subjective:    Patient ID: Morgan Day, female    DOB: 1985/03/21, 29 y.o.   MRN: [id number]  HPI  Pt in states her anxiety has been controlled. She states she had used used 3.5 tabs of her clonozapam. This was over past [place]. She states this stopped panic attacks. No depression.   Pt in today reporting urinary symptoms. Symptom for one week.   Dysuria- yes Frequent urination-yes Hesitancy- Suprapubic pressure-yes Fever-no chills-no Nausea-no Vomiting-no History of UTI-yes Gross hematuria-no  LMP- now.     Review of Systems  Constitutional: Negative for fever, chills, diaphoresis, activity change and fatigue.  Respiratory: Negative for cough, chest tightness and shortness of breath.   Cardiovascular: Negative for chest pain, palpitations and leg swelling.  Gastrointestinal: Negative for nausea, vomiting and abdominal pain.  Genitourinary: Positive for dysuria and frequency. Negative for hematuria, flank pain, vaginal bleeding, difficulty urinating and vaginal pain.  Musculoskeletal: Negative for neck pain and neck stiffness.  Neurological: Negative for dizziness, tremors, seizures, syncope, facial asymmetry, speech difficulty, weakness, light-headedness, numbness and headaches.  Psychiatric/Behavioral: Negative for behavioral problems, confusion and agitation. The patient is nervous/anxious.        See hpi    Past Medical History  Diagnosis Date  . Asthma   . Anxiety   . Frequent UTI   . Depression   . Chronic kidney disease     History   Social History  . Marital Status: Single    Spouse Name: N/A  . Number of Children: N/A  . Years of Education: N/A   Occupational History  . Not on file.   Social History Main Topics  . Smoking status: Current Some Day Smoker    Types: Cigarettes  . Smokeless tobacco: Not on file  . Alcohol Use: 0.0 oz/week    0 Standard drinks or equivalent per week     Comment: social  . Drug Use: Not on file  .  Sexual Activity: No   Other Topics Concern  . Not on file   Social History Narrative    Past Surgical History  Procedure Laterality Date  . Neck surgery      Family History  Problem Relation Age of Onset  . Cancer Mother   . Ataxia Mother   . Depression Mother   . Anxiety disorder Mother     No Known Allergies  Current Outpatient Prescriptions on File Prior to Visit  Medication Sig Dispense Refill  . clonazePAM (KLONOPIN) 0.5 MG tablet Take 1 tablet (0.5 mg total) by mouth 2 (two) times daily as needed for anxiety. 12 tablet 0  . levonorgestrel-ethinyl estradiol (AVIANE,ALESSE,LESSINA) 0.1-20 MG-MCG tablet Take 1 tablet by mouth daily.    . SUMAtriptan (IMITREX) 100 MG tablet Take 100 mg by mouth every 2 (two) hours as needed for migraine. May repeat in 2 hours if headache persists or recurs.    . diclofenac (VOLTAREN) 75 MG EC tablet Take 1 tablet (75 mg total) by mouth 2 (two) times daily. (Patient not taking: Reported on 07/26/2014) 30 tablet 0   No current facility-administered medications on file prior to visit.    BP 131/104 mmHg  Pulse 99  Temp(Src) 98 F (36.7 C) (Oral)  Ht  (1.727 m)  Wt 124 lb 12.8 oz (56.609 kg)  BMI 18.98 kg/m2  SpO2 99%  LMP 07/26/2014       Objective:   Physical Exam  General  Mental Status- Alert. Orientation- Orientation x 4.   Skin  General:- Normal. Moisture- Dry. Temperature- Warm.    Heart Ausculation-RRR  Lungs Ausculation- Clear, even, unlabored bilaterlly.    Abdomen Palpation/Percussion: Palpation and Percussion of the abdomen reveal- faint suprapubic  Tender, No Rebound tenderness, No Rigidity(guarding), No Palpable abdominal masses and No jar tenderness. No suprapubic tenderness. Liver:-Normal. Spleen:- Normal. Other Characteristics- No Costovertebral angle tenderness- Left or Costovertebral angle tenderness- Right.  Auscultation: Auscultation of the abdomen reveals- Bowel Sounds  normal.    Neurologic Cranial Nerve exam:- CN III-XII intact(No nystagmus), symmetric smile. Strength:- 5/5 equal and symmetric strength both upper and lower extremities.     Assessment & Plan:

## 2014-07-26 NOTE — Patient Instructions (Signed)
Generalized anxiety disorder Controlled with rare use of clonazapam. Continue intermittent use sparingly. If more chronic and daily. Then would add SSRI.  UTI (urinary tract infection) Your appear to have a urinary tract infection. I am prescribing macrobid antibiotic for the probable infection. Hydrate well. I am sending out a urine culture. During the interim if your signs and symptoms worsen rather than improving please notify us. We will notify your when the culture results are back.  Follow up in 7 days or as needed.    Regarding anxiety. When you run out of clonazapam. Give us a call and will refill.

## 2014-07-26 NOTE — Assessment & Plan Note (Signed)
Controlled with rare use of clonazapam. Continue intermittent use sparingly. If more chronic and daily. Then would add SSRI.

## 2014-07-26 NOTE — Assessment & Plan Note (Signed)
Your appear to have a urinary tract infection . I am prescribing macrobid  antibiotic for the probable infection. Hydrate well. I am sending out a urine culture. During the interim if your signs and symptoms worsen rather than improving please notify us. We will notify your when the culture results are back.  Follow up in 7 days or as needed. 

## 2014-07-26 NOTE — Addendum Note (Signed)
Addended by: Eustace QuailEABOLD, Bartlett Enke J on: 07/26/2014 10:24 AM   Modules accepted: Orders

## 2014-07-26 NOTE — Progress Notes (Signed)
Pre visit review using our clinic review tool, if applicable. No additional management support is needed unless otherwise documented below in the visit note. 

## 2014-07-28 LAB — URINE CULTURE: Colony Count: >=100000

## 2014-08-17 ENCOUNTER — Ambulatory Visit: Admit: 2014-08-17 | Payer: 59 | Admitting: General Surgery

## 2014-08-17 SURGERY — REPAIR, HERNIA, VENTRAL, LAPAROSCOPIC
Anesthesia: General

## 2014-08-19 ENCOUNTER — Emergency Department (HOSPITAL_BASED_OUTPATIENT_CLINIC_OR_DEPARTMENT_OTHER)
Admission: EM | Admit: 2014-08-19 | Discharge: 2014-08-19 | Disposition: A | Discharge status: Home / Self Care | Payer: 59 | Attending: Emergency Medicine | Admitting: Emergency Medicine

## 2014-08-19 ENCOUNTER — Encounter (HOSPITAL_BASED_OUTPATIENT_CLINIC_OR_DEPARTMENT_OTHER): Payer: Self-pay | Admitting: *Deleted

## 2014-08-19 ENCOUNTER — Emergency Department (HOSPITAL_BASED_OUTPATIENT_CLINIC_OR_DEPARTMENT_OTHER): Payer: 59

## 2014-08-19 DIAGNOSIS — G8918 Other acute postprocedural pain: Secondary | ICD-10-CM | POA: Diagnosis not present

## 2014-08-19 DIAGNOSIS — Z720 Tobacco use: Secondary | ICD-10-CM | POA: Diagnosis not present

## 2014-08-19 DIAGNOSIS — F329 Major depressive disorder, single episode, unspecified: Secondary | ICD-10-CM | POA: Insufficient documentation

## 2014-08-19 DIAGNOSIS — F419 Anxiety disorder, unspecified: Secondary | ICD-10-CM | POA: Diagnosis not present

## 2014-08-19 DIAGNOSIS — N189 Chronic kidney disease, unspecified: Secondary | ICD-10-CM | POA: Diagnosis not present

## 2014-08-19 DIAGNOSIS — Z3202 Encounter for pregnancy test, result negative: Secondary | ICD-10-CM | POA: Insufficient documentation

## 2014-08-19 DIAGNOSIS — Z87440 Personal history of urinary (tract) infections: Secondary | ICD-10-CM | POA: Diagnosis not present

## 2014-08-19 DIAGNOSIS — Z793 Long term (current) use of hormonal contraceptives: Secondary | ICD-10-CM | POA: Insufficient documentation

## 2014-08-19 DIAGNOSIS — Z79899 Other long term (current) drug therapy: Secondary | ICD-10-CM | POA: Insufficient documentation

## 2014-08-19 DIAGNOSIS — R109 Unspecified abdominal pain: Secondary | ICD-10-CM | POA: Diagnosis present

## 2014-08-19 DIAGNOSIS — K5900 Constipation, unspecified: Secondary | ICD-10-CM | POA: Insufficient documentation

## 2014-08-19 DIAGNOSIS — J45909 Unspecified asthma, uncomplicated: Secondary | ICD-10-CM | POA: Insufficient documentation

## 2014-08-19 DIAGNOSIS — Z9889 Other specified postprocedural states: Secondary | ICD-10-CM | POA: Insufficient documentation

## 2014-08-19 LAB — URINALYSIS, ROUTINE W REFLEX MICROSCOPIC
BILIRUBIN URINE: NEGATIVE
GLUCOSE, UA: NEGATIVE mg/dL
HGB URINE DIPSTICK: NEGATIVE
KETONES UR: NEGATIVE mg/dL
Leukocytes, UA: NEGATIVE
Nitrite: NEGATIVE
PROTEIN: NEGATIVE mg/dL
Specific Gravity, Urine: 1.008 (ref 1.005–1.030)
UROBILINOGEN UA: 0.2 mg/dL (ref 0.0–1.0)
pH: 7 (ref 5.0–8.0)

## 2014-08-19 LAB — COMPREHENSIVE METABOLIC PANEL
ALBUMIN: 4.3 g/dL (ref 3.5–5.0)
ALK PHOS: 59 U/L (ref 38–126)
ALT: 16 U/L (ref 14–54)
AST: 19 U/L (ref 15–41)
Anion gap: 8 (ref 5–15)
BILIRUBIN TOTAL: 1 mg/dL (ref 0.3–1.2)
BUN: 7 mg/dL (ref 6–20)
CO2: 27 mmol/L (ref 22–32)
Calcium: 9.1 mg/dL (ref 8.9–10.3)
Chloride: 102 mmol/L (ref 101–111)
Creatinine, Ser: 0.64 mg/dL (ref 0.44–1.00)
GFR calc Af Amer: >60 mL/min (ref >60)
GFR calc non Af Amer: >60 mL/min (ref >60)
Glucose, Bld: 98 mg/dL (ref 65–99)
POTASSIUM: 3.6 mmol/L (ref 3.5–5.1)
SODIUM: 137 mmol/L (ref 135–145)
TOTAL PROTEIN: 6.9 g/dL (ref 6.5–8.1)

## 2014-08-19 LAB — CBC WITH DIFFERENTIAL/PLATELET
BASOS ABS: 0 10*3/uL (ref 0.0–0.1)
Basophils Relative: 0 % (ref 0–1)
EOS ABS: 0.1 10*3/uL (ref 0.0–0.7)
Eosinophils Relative: 1 % (ref 0–5)
HCT: 41.8 % (ref 36.0–46.0)
HEMOGLOBIN: 14.4 g/dL (ref 12.0–15.0)
LYMPHS ABS: 1.4 10*3/uL (ref 0.7–4.0)
Lymphocytes Relative: 20 % (ref 12–46)
MCH: 32.4 pg (ref 26.0–34.0)
MCHC: 34.4 g/dL (ref 30.0–36.0)
MCV: 94.1 fL (ref 78.0–100.0)
Monocytes Absolute: 0.6 10*3/uL (ref 0.1–1.0)
Monocytes Relative: 9 % (ref 3–12)
Neutro Abs: 4.9 10*3/uL (ref 1.7–7.7)
Neutrophils Relative %: 70 % (ref 43–77)
Platelets: 245 10*3/uL (ref 150–400)
RBC: 4.44 MIL/uL (ref 3.87–5.11)
RDW: 12.6 % (ref 11.5–15.5)
WBC: 7 10*3/uL (ref 4.0–10.5)

## 2014-08-19 LAB — LIPASE, BLOOD: Lipase: 21 U/L — ABNORMAL LOW (ref 22–51)

## 2014-08-19 LAB — PREGNANCY, URINE: PREG TEST UR: NEGATIVE

## 2014-08-19 MED ORDER — HYDROMORPHONE HCL 1 MG/ML IJ SOLN
1.0000 mg | Freq: Once | INTRAMUSCULAR | Status: AC
  Administered 2014-08-19: 1 mg via INTRAVENOUS
  Filled 2014-08-19: qty 1

## 2014-08-19 MED ORDER — IOHEXOL 300 MG/ML  SOLN
50.0000 mL | Freq: Once | INTRAMUSCULAR | Status: AC | PRN
  Administered 2014-08-19: 50 mL via ORAL

## 2014-08-19 MED ORDER — IOHEXOL 300 MG/ML  SOLN
100.0000 mL | Freq: Once | INTRAMUSCULAR | Status: AC | PRN
  Administered 2014-08-19: 100 mL via INTRAVENOUS

## 2014-08-19 MED ORDER — ONDANSETRON HCL 4 MG/2ML IJ SOLN
4.0000 mg | Freq: Once | INTRAMUSCULAR | Status: AC
  Administered 2014-08-19: 4 mg via INTRAVENOUS
  Filled 2014-08-19: qty 2

## 2014-08-19 MED ORDER — POLYETHYLENE GLYCOL 3350 17 G PO PACK: 17.0000 g | PACK | Freq: Every day | ORAL | Status: DC

## 2014-08-19 MED ORDER — BISACODYL 10 MG RE SUPP
10.0000 mg | Freq: Once | RECTAL | Status: AC
  Administered 2014-08-19: 10 mg via RECTAL
  Filled 2014-08-19: qty 1

## 2014-08-19 NOTE — ED Notes (Signed)
Patient c/o R flank and R shoulder pain, hurts to take a breath

## 2014-08-19 NOTE — Discharge Instructions (Signed)

## 2014-08-19 NOTE — ED Provider Notes (Signed)
CSN: [id number]     Arrival date & time 08/19/14  0804 History   First MD Initiated Contact with Patient 08/19/14 [phone]     Chief Complaint  Patient presents with  . Flank Pain      HPI Patient presents to emergency room with abdominal pain in right flank pain.  Patient had hernia repair surgery on Thursday.  Pain is much worse when she presented and moves about.  Become fairly severe.  She denies any known fever.  Has had decreased appetite. Past Medical History  Diagnosis Date  . Asthma   . Anxiety   . Frequent UTI   . Depression   . Chronic kidney disease    Past Surgical History  Procedure Laterality Date  . Neck surgery    . Hernia repair     Family History  Problem Relation Age of Onset  . Cancer Mother   . Ataxia Mother   . Depression Mother   . Anxiety disorder Mother    History  Substance Use Topics  . Smoking status: Current Some Day Smoker    Types: Cigarettes  . Smokeless tobacco: Not on file  . Alcohol Use: 0.0 oz/week    0 Standard drinks or equivalent per week     Comment: social   OB History    No data available     Review of Systems  Constitutional: Negative for fever.  Gastrointestinal: Positive for abdominal pain.      Allergies  Review of patient's allergies indicates no known allergies.  Home Medications   Prior to Admission medications   Medication Sig Start Date End Date Taking? Authorizing Provider  oxyCODONE-acetaminophen (PERCOCET/ROXICET) 5-325 MG per tablet Take 2 tablets by mouth every 4 (four) hours as needed for severe pain.   Yes Historical Provider, MD  clonazePAM (KLONOPIN) 0.5 MG tablet Take 1 tablet (0.5 mg total) by mouth 2 (two) times daily as needed for anxiety. 06/26/14   Ramon Dredge Saguier, PA-C  diclofenac (VOLTAREN) 75 MG EC tablet Take 1 tablet (75 mg total) by mouth 2 (two) times daily. Patient not taking: Reported on 07/26/2014 06/26/14   Esperanza Richters, PA-C  levonorgestrel-ethinyl estradiol (AVIANE,ALESSE,LESSINA)  0.1-20 MG-MCG tablet Take 1 tablet by mouth daily.    Historical Provider, MD  nitrofurantoin, macrocrystal-monohydrate, (MACROBID) 100 MG capsule Take 1 capsule (100 mg total) by mouth 2 (two) times daily. 07/26/14   Ramon Dredge Saguier, PA-C  polyethylene glycol [facility]) packet Take 17 g by mouth daily. 08/19/14   Nelva Nay, MD  SUMAtriptan (IMITREX) 100 MG tablet Take 100 mg by mouth every 2 (two) hours as needed for migraine. May repeat in 2 hours if headache persists or recurs.    Historical Provider, MD   BP 125/81 mmHg  Pulse 80  Temp(Src) 97.5 F (36.4 C) (Oral)  Resp 18  Ht 5\' 8"  (1.727 m)  Wt 125 lb (56.7 kg)  BMI 19.01 kg/m2  SpO2 99%  LMP 07/26/2014 Physical Exam  Constitutional: She is oriented to person, place, and time. She appears well-developed and well-nourished. No distress.  HENT:  Head: Normocephalic and atraumatic.  Eyes: Pupils are equal, round, and reactive to light.  Neck: Normal range of motion.  Cardiovascular: Normal rate and intact distal pulses.   Pulmonary/Chest: No respiratory distress.  Abdominal: Normal appearance. She exhibits no distension. There is tenderness in the right upper quadrant. There is guarding. There is no rebound.    Musculoskeletal: Normal range of motion.  Neurological: She is alert and oriented  to person, place, and time. No cranial nerve deficit.  Skin: Skin is warm and dry. No rash noted.  Psychiatric: She has a normal mood and affect. Her behavior is normal.  Nursing note and vitals reviewed.   ED Course  Procedures (including critical care time) Medications  HYDROmorphone (DILAUDID) injection 1 mg (1 mg Intravenous Given 08/19/14 0843)  ondansetron (ZOFRAN) injection 4 mg (4 mg Intravenous Given 08/19/14 0843)  iohexol (OMNIPAQUE) 300 MG/ML solution 50 mL (50 mLs Oral Contrast Given 08/19/14 0944)  iohexol (OMNIPAQUE) 300 MG/ML solution 100 mL (100 mLs Intravenous Contrast Given 08/19/14 0944)  bisacodyl (DULCOLAX) suppository  10 mg (10 mg Rectal Given 08/19/14 1118)  HYDROmorphone (DILAUDID) injection 1 mg (1 mg Intravenous Given 08/19/14 1112)    Labs Review Labs Reviewed  LIPASE, BLOOD - Abnormal; Notable for the following:    Lipase 21 (*)    All other components within normal limits  URINE CULTURE  CBC WITH DIFFERENTIAL/PLATELET  COMPREHENSIVE METABOLIC PANEL  PREGNANCY, URINE  URINALYSIS, ROUTINE W REFLEX MICROSCOPIC (NOT AT [facility])    Imaging Review Ct Abdomen Pelvis W Contrast  08/19/2014   CLINICAL DATA:  Right abdominal/ flank pain, nausea, recent umbilical hernia repair on Thursday  EXAM: CT ABDOMEN AND PELVIS WITH CONTRAST  TECHNIQUE: Multidetector CT imaging of the abdomen and pelvis was performed using the standard protocol following bolus administration of intravenous contrast.  CONTRAST:  OMNIPAQUE IOHEXOL 300 MG/ML SOLN, 50mL OMNIPAQUE IOHEXOL 300 MG/ML SOLN  COMPARISON:  None.  FINDINGS: Lower chest:  Lung bases are clear.  Hepatobiliary: Liver is notable for mild periportal edema. No suspicious/enhancing hepatic lesions.  Gallbladder is unremarkable. No intrahepatic or extrahepatic ductal dilatation.  Pancreas: Within normal limits.  Spleen: Mild splenomegaly, measuring 16.6 cm in craniocaudal dimension.  Adrenals/Urinary Tract: Adrenal glands are within normal limits.  Left renal cortical scarring/ atrophy. 3 mm parenchymal calcification along the medial left lower kidney (series 2/ image 27).  Right kidney is within normal limits.  No hydronephrosis.  Bladder is within normal limits.  Stomach/Bowel: Stomach is within normal limits.  No evidence of bowel obstruction.  Appendix is not discretely visualized.  Moderate colonic stool burden, suggesting constipation.  Vascular/Lymphatic: No evidence of abdominal aortic aneurysm.  No suspicious abdominopelvic lymphadenopathy.  Reproductive: Uterus is within normal limits.  No adnexal masses.  Other: Small volume pelvic ascites.  Scattered foci of free air  (series 2/ image 23), likely related to recent surgery.  Postsurgical changes in the anterior abdominal wall with a few scattered foci of gas (series 2/ image 43).  Musculoskeletal: Visualized osseous structures are within normal limits.  IMPRESSION: Postsurgical changes in the anterior abdominal wall related to recent umbilical hernia repair.  Scattered foci of pneumoperitoneum, postsurgical.  No evidence of bowel obstruction. Moderate colonic stool burden, suggesting constipation.  Small volume pelvic ascites.  Additional ancillary findings as above.   Electronically Signed   By: Charline Bills M.D.   On: 08/19/2014 10:40     Labs and CT scan revealed no significant abnormalities post surgery.  Patient was noted to have constipation.  Prescriptions and treatment were given.  Follow-up with surgeon on Monday or sooner if needed. MDM   Final diagnoses:  Post-operative pain  Constipation, unspecified constipation type        Nelva Nay, MD 08/20/14 [phone]

## 2014-08-21 LAB — URINE CULTURE: CULTURE: NO GROWTH

## 2014-09-07 ENCOUNTER — Ambulatory Visit (INDEPENDENT_AMBULATORY_CARE_PROVIDER_SITE_OTHER): Payer: 59 | Admitting: Medical

## 2014-09-07 ENCOUNTER — Encounter: Payer: Self-pay | Admitting: Medical

## 2014-09-07 VITALS — BP 118/80 | HR 88 | Temp 98.1°F | Resp 16 | Ht 68.0 in | Wt 128.4 lb

## 2014-09-07 DIAGNOSIS — N390 Urinary tract infection, site not specified: Secondary | ICD-10-CM

## 2014-09-07 DIAGNOSIS — R350 Frequency of micturition: Secondary | ICD-10-CM | POA: Diagnosis not present

## 2014-09-07 DIAGNOSIS — R82998 Other abnormal findings in urine: Secondary | ICD-10-CM

## 2014-09-07 LAB — POCT URINALYSIS DIPSTICK
BILIRUBIN UA: 1
Blood, UA: POSITIVE
GLUCOSE UA: NEGATIVE
Ketones, UA: NEGATIVE
Nitrite, UA: POSITIVE
Protein, UA: 30
SPEC GRAV UA: 1.025
UROBILINOGEN UA: 0.2
pH, UA: 6

## 2014-09-07 MED ORDER — SULFAMETHOXAZOLE-TRIMETHOPRIM 800-160 MG PO TABS: 1.0000 | ORAL_TABLET | Freq: Two times a day (BID) | ORAL | Status: DC

## 2014-09-07 NOTE — Progress Notes (Signed)
Subjective:    Patient ID: Morgan Day, female    DOB: 10-02-1985, 29 y.o.   MRN: [id number]  HPI  Pt in for some urinary symptoms x 3 days. Pt has pain urinating on onset of flow. Frequent urination and bad smell. No fever, no chills and no sweats.Some mild lower back pain last night.  Pt has hx of uti. Beginning of July + urine culture. She gets uti every 2 months.   LMP- 3 wks ago nml cycle. She is on ocp.    Review of Systems  Constitutional: Negative for fever, chills and fatigue.  Respiratory: Negative for cough, chest tightness and shortness of breath.   Cardiovascular: Negative for chest pain and palpitations.  Gastrointestinal: Negative for abdominal pain, constipation and blood in stool.  Genitourinary: Positive for dysuria and frequency. Negative for urgency and pelvic pain.       Bad odor to urine.  Musculoskeletal: Negative for back pain.       Some back pain last night but none now.  Neurological: Negative for dizziness and headaches.  Hematological: Negative for adenopathy. Does not bruise/bleed easily.  Psychiatric/Behavioral: Negative for behavioral problems and confusion.     Past Medical History  Diagnosis Date  . Asthma   . Anxiety   . Frequent UTI   . Depression   . Chronic kidney disease     Social History   Social History  . Marital Status: Single    Spouse Name: N/A  . Number of Children: N/A  . Years of Education: N/A   Occupational History  . Not on file.   Social History Main Topics  . Smoking status: Current Some Day Smoker    Types: Cigarettes  . Smokeless tobacco: Not on file  . Alcohol Use: 0.0 oz/week    0 Standard drinks or equivalent per week     Comment: social  . Drug Use: Not on file  . Sexual Activity: No   Other Topics Concern  . Not on file   Social History Narrative    Past Surgical History  Procedure Laterality Date  . Neck surgery    . Hernia repair      Family History  Problem Relation Age  of Onset  . Cancer Mother   . Ataxia Mother   . Depression Mother   . Anxiety disorder Mother     No Known Allergies  Current Outpatient Prescriptions on File Prior to Visit  Medication Sig Dispense Refill  . clonazePAM (KLONOPIN) 0.5 MG tablet Take 1 tablet (0.5 mg total) by mouth 2 (two) times daily as needed for anxiety. 12 tablet 0  . diclofenac (VOLTAREN) 75 MG EC tablet Take 1 tablet (75 mg total) by mouth 2 (two) times daily. 30 tablet 0  . levonorgestrel-ethinyl estradiol (AVIANE,ALESSE,LESSINA) 0.1-20 MG-MCG tablet Take 1 tablet by mouth daily.    Marland Kitchen oxyCODONE-acetaminophen (PERCOCET/ROXICET) 5-325 MG per tablet Take 2 tablets by mouth every 4 (four) hours as needed for severe pain.    . SUMAtriptan (IMITREX) 100 MG tablet Take 100 mg by mouth every 2 (two) hours as needed for migraine. May repeat in 2 hours if headache persists or recurs.     No current facility-administered medications on file prior to visit.    BP 118/80 mmHg  Pulse 88  Temp(Src) 98.1 F (36.7 C) (Oral)  Resp 16  Ht 5\' 8"  (1.727 m)  Wt 128 lb 6.4 oz (58.242 kg)  BMI 19.53 kg/m2  SpO2 99%  LMP  08/21/2014       Objective:   Physical Exam  General Appearance- Not in acute distress.  HEENT Eyes- Scleraeral/Conjuntiva-bilat- Not Yellow. Mouth & Throat- Normal.  Chest and Lung Exam Auscultation: Breath sounds:-Normal. Adventitious sounds:- No Adventitious sounds.  Cardiovascular Auscultation:Rythm - Regular. Heart Sounds -Normal heart sounds.  Abdomen Inspection:-Inspection Normal.  Palpation/Perucssion: Palpation and Percussion of the abdomen reveal- faint suprapubic Tender, No Rebound tenderness, No rigidity(Guarding) and No Palpable abdominal masses.  Liver:-Normal.  Spleen:- Normal.    Back- no cva tenderness.  Skin- mild redness to nose. Left side comedome and mild swollen. But not tender.  .      Assessment & Plan:  Your appear to have a urinary tract infection. I am  prescribing bactrim ds antibiotic for the probable infection. Hydrate well. I am sending out a urine culture. During the interim if your signs and symptoms worsen rather than improving please notify us. We will notify your when the culture results are back.  Also your redness of nose could be early skin infection. I think bactrim would provide coverage of potential skin bacteria as well.(Rx advisement given make sure taking ocp daily. Not skipping any tabs)  Follow up in 7 days or as needed.

## 2014-09-07 NOTE — Progress Notes (Signed)
Pre visit review using our clinic review tool, if applicable. No additional management support is needed unless otherwise documented below in the visit note. 

## 2014-09-07 NOTE — Patient Instructions (Addendum)
Your appear to have a urinary tract infection. I am prescribing bactrim ds antibiotic for the probable infection. Hydrate well. I am sending out a urine culture. During the interim if your signs and symptoms worsen rather than improving please notify us. We will notify your when the culture results are back.  Also your redness of nose could be early skin infection. I think bactrim would provide coverage of potential skin bacteria as well.(Rx advisement given make sure taking ocp daily. Not skipping any tabs)  Follow up in 7 days or as needed.

## 2014-09-10 LAB — URINE CULTURE: Colony Count: >=100000

## 2014-09-11 ENCOUNTER — Telehealth: Payer: Self-pay | Admitting: Medical

## 2014-09-11 MED ORDER — PHENAZOPYRIDINE HCL 200 MG PO TABS
200.0000 mg | ORAL_TABLET | Freq: Three times a day (TID) | ORAL | Status: DC | PRN
Start: 2014-09-11 — End: 2015-02-02

## 2014-09-11 MED ORDER — NITROFURANTOIN MONOHYD MACRO 100 MG PO CAPS: 100.0000 mg | ORAL_CAPSULE | Freq: Two times a day (BID) | ORAL | Status: DC

## 2014-09-11 NOTE — Telephone Encounter (Signed)
Called patient regarding medication changes.

## 2014-09-11 NOTE — Telephone Encounter (Signed)
Let pt know stop bactrim since bacteria appears resistance to the antibiotic. Start macrobid. I did rx pyridium to help with bladder pain as well.

## 2014-09-21 ENCOUNTER — Telehealth: Payer: Self-pay | Admitting: Medical

## 2014-09-21 NOTE — Telephone Encounter (Signed)
Relation to pt: self  Call back number:[phone] Pharmacy: [id number] [place], [place] - [address] [phone] (Phone) [phone] (Fax)         Reason for call:  Patient requesting a refill clonazePAM (KLONOPIN) 0.5 MG tablet

## 2014-09-21 NOTE — Telephone Encounter (Signed)
Pt last seen 09/07/14.  Please advise.

## 2014-09-22 ENCOUNTER — Encounter: Payer: Self-pay | Admitting: Medical

## 2014-09-22 MED ORDER — CLONAZEPAM 0.5 MG PO TABS: 0.5000 mg | ORAL_TABLET | Freq: Two times a day (BID) | ORAL | Status: DC | PRN

## 2014-09-22 NOTE — Progress Notes (Unsigned)
Called patient advised Rx foe Klonopin will be left at front desk.

## 2014-09-22 NOTE — Telephone Encounter (Signed)
Left message for patient to pick up Rx on answering machine.

## 2014-09-22 NOTE — Telephone Encounter (Signed)
Pt can pick up rx of clonazapam

## 2014-10-06 ENCOUNTER — Encounter: Payer: Self-pay | Admitting: Medical

## 2014-10-06 ENCOUNTER — Ambulatory Visit (INDEPENDENT_AMBULATORY_CARE_PROVIDER_SITE_OTHER): Payer: 59 | Admitting: Medical

## 2014-10-06 VITALS — BP 139/90 | HR 88 | Temp 97.8°F | Ht 68.0 in | Wt 130.8 lb

## 2014-10-06 DIAGNOSIS — N390 Urinary tract infection, site not specified: Secondary | ICD-10-CM

## 2014-10-06 DIAGNOSIS — R350 Frequency of micturition: Secondary | ICD-10-CM | POA: Diagnosis not present

## 2014-10-06 DIAGNOSIS — R82998 Other abnormal findings in urine: Secondary | ICD-10-CM

## 2014-10-06 LAB — POCT URINALYSIS DIPSTICK
Blood, UA: NEGATIVE
Glucose, UA: NEGATIVE
PROTEIN UA: NEGATIVE
SPEC GRAV UA: 1.01
UROBILINOGEN UA: 0.2
pH, UA: 6.5

## 2014-10-06 MED ORDER — NITROFURANTOIN MONOHYD MACRO 100 MG PO CAPS: ORAL_CAPSULE | ORAL | Status: DC

## 2014-10-06 NOTE — Patient Instructions (Addendum)
You appear to have a urinary tract infection. I am prescribing macrobid  antibiotic for the probable infection. Hydrate well. I am sending out a urine culture. During the interim if your signs and symptoms worsen rather than improving please notify us. We will notify your when the culture results are back. Also due to chronic recurrent infections will prescribe preventative tabs to use 1 tab po after sex.  Follow up in 7 days or as needed.

## 2014-10-06 NOTE — Progress Notes (Signed)
Subjective:    Patient ID: Morgan Day, female    DOB: January 29, 1985, 29 y.o.   MRN: [id number]  HPI  Pt states she is recently noticing that every time has sex with husband will get uti type symptoms. Pt urinates before and after but still gets symptoms.  Pt in today reporting urinary symptoms.  Dysuria- yes Frequent urination-yes Hesitancy-no Suprapubic pressure-mild suprapubic tenderness. Fever-no chills-no Nausea-no Vomiting-no CVA pain-no History of UTI-yes Gross hematuria-no  Pt had about 4-5 uti since this year. And reports going to urgent care often before being seen as pt here.    Review of Systems  Constitutional: Negative for fever, chills and fatigue.  Respiratory: Negative for cough, chest tightness, shortness of breath and wheezing.   Cardiovascular: Negative for chest pain and palpitations.  Genitourinary: Positive for dysuria, urgency and frequency. Negative for hematuria, decreased urine volume and vaginal pain.  Musculoskeletal: Negative for back pain and neck stiffness.  Hematological: Negative for adenopathy. Does not bruise/bleed easily.  Psychiatric/Behavioral: Negative for behavioral problems and confusion.      Past Medical History  Diagnosis Date  . Asthma   . Anxiety   . Frequent UTI   . Depression   . Chronic kidney disease     Social History   Social History  . Marital Status: Single    Spouse Name: N/A  . Number of Children: N/A  . Years of Education: N/A   Occupational History  . Not on file.   Social History Main Topics  . Smoking status: Current Some Day Smoker    Types: Cigarettes  . Smokeless tobacco: Not on file  . Alcohol Use: 0.0 oz/week    0 Standard drinks or equivalent per week     Comment: social  . Drug Use: Not on file  . Sexual Activity: No   Other Topics Concern  . Not on file   Social History Narrative    Past Surgical History  Procedure Laterality Date  . Neck surgery    . Hernia  repair      Family History  Problem Relation Age of Onset  . Cancer Mother   . Ataxia Mother   . Depression Mother   . Anxiety disorder Mother     No Known Allergies  Current Outpatient Prescriptions on File Prior to Visit  Medication Sig Dispense Refill  . phenazopyridine (PYRIDIUM) 200 MG tablet Take 1 tablet (200 mg total) by mouth 3 (three) times daily as needed for pain. 6 tablet 0  . clonazePAM (KLONOPIN) 0.5 MG tablet Take 1 tablet (0.5 mg total) by mouth 2 (two) times daily as needed for anxiety. 12 tablet 0  . diclofenac (VOLTAREN) 75 MG EC tablet Take 1 tablet (75 mg total) by mouth 2 (two) times daily. 30 tablet 0  . levonorgestrel-ethinyl estradiol (AVIANE,ALESSE,LESSINA) 0.1-20 MG-MCG tablet Take 1 tablet by mouth daily.    . SUMAtriptan (IMITREX) 100 MG tablet Take 100 mg by mouth every 2 (two) hours as needed for migraine. May repeat in 2 hours if headache persists or recurs.     No current facility-administered medications on file prior to visit.    BP 139/90 mmHg  Pulse 88  Temp(Src) 97.8 F (36.6 C) (Oral)  Ht  (1.727 m)  Wt 130 lb 12.8 oz (59.33 kg)  BMI 19.89 kg/m2  SpO2 100%  LMP 09/16/2014       Objective:   Physical Exam   General  Mental Status- Alert. Orientation- Orientation x  4.   Skin General:- Normal. Moisture- Dry. Temperature- Warm.  HEENT Head- normal.  Neck Neck- Supple.  Heart Ausculation-RRR  Lungs Ausculation- Clear, even, unlabored bilaterlly.    Abdomen Palpation/Percussion: Palpation and Percussion of the abdomen reveal- faint suprapubic Tender, No Rebound tenderness, No Rigidity(guarding), No Palpable abdominal masses and No jar tenderness. No suprapubic tenderness. Liver:-Normal. Spleen:- Normal. Other Characteristics- No Costovertebral angle tenderness- Left or Costovertebral angle tenderness- Right.  Auscultation: Auscultation of the abdomen reveals- Bowel Sounds normal.  Back- no cva tenderness.       Assessment & Plan:  You appear to have a urinary tract infection. I am prescribing macrobid  antibiotic for the probable infection. Hydrate well. I am sending out a urine culture. During the interim if your signs and symptoms worsen rather than improving please notify us. We will notify your when the culture results are back. Also due to chronic recurrent infections will prescribe preventative tabs to use 1 tab po after sex.  Follow up in 7 days or as needed.

## 2014-10-06 NOTE — Progress Notes (Signed)
Pre visit review using our clinic review tool, if applicable. No additional management support is needed unless otherwise documented below in the visit note. 

## 2014-10-09 LAB — URINE CULTURE

## 2014-12-27 ENCOUNTER — Telehealth: Payer: Self-pay | Admitting: Medical

## 2014-12-27 MED ORDER — CLONAZEPAM 0.5 MG PO TABS: 0.5000 mg | ORAL_TABLET | Freq: Two times a day (BID) | ORAL | Status: DC | PRN

## 2014-12-27 NOTE — Telephone Encounter (Signed)
Last refill 09/22/14 Last OV 10/06/14. Please advise on refill.

## 2014-12-27 NOTE — Telephone Encounter (Signed)
Let pt know I sent in rx of clonzepam.

## 2014-12-27 NOTE — Telephone Encounter (Signed)
Relation to ZO:XWRUpt:self Call back number:[phone] Pharmacy: [facility] [address], [place], [place] [address] [phone]    Reason for call:  Patient requesting a refill clonazePAM (KLONOPIN) 0.5 MG tablet

## 2014-12-27 NOTE — Telephone Encounter (Signed)
Left message for pt that medication was sent to pharmacy.

## 2015-01-25 ENCOUNTER — Telehealth: Payer: Self-pay | Admitting: Medical

## 2015-01-25 MED ORDER — NITROFURANTOIN MONOHYD MACRO 100 MG PO CAPS: ORAL_CAPSULE | ORAL | Status: DC

## 2015-01-25 NOTE — Telephone Encounter (Signed)
Antibiotic refilled for uti prevention

## 2015-01-25 NOTE — Telephone Encounter (Signed)
Caller name:Nefertari Relationship to patient:self Can be reached:[phone] Pharmacy:[facility] [address]   Reason for call:  macrobid 100 mg  As needed  Qty 40

## 2015-01-25 NOTE — Telephone Encounter (Signed)
Pt is needing a refill of the macrobid that is taken after sex. Please advise on refill.

## 2015-02-02 ENCOUNTER — Encounter: Payer: Self-pay | Admitting: Medical

## 2015-02-02 ENCOUNTER — Ambulatory Visit (INDEPENDENT_AMBULATORY_CARE_PROVIDER_SITE_OTHER): Payer: 59 | Admitting: Medical

## 2015-02-02 VITALS — BP 120/80 | HR 88 | Temp 97.7°F | Ht 68.0 in | Wt 129.4 lb

## 2015-02-02 DIAGNOSIS — J209 Acute bronchitis, unspecified: Secondary | ICD-10-CM | POA: Diagnosis not present

## 2015-02-02 DIAGNOSIS — R05 Cough: Secondary | ICD-10-CM

## 2015-02-02 DIAGNOSIS — R062 Wheezing: Secondary | ICD-10-CM

## 2015-02-02 DIAGNOSIS — R059 Cough, unspecified: Secondary | ICD-10-CM

## 2015-02-02 MED ORDER — BENZONATATE 100 MG PO CAPS: 100.0000 mg | ORAL_CAPSULE | Freq: Three times a day (TID) | ORAL | Status: DC | PRN

## 2015-02-02 MED ORDER — ALBUTEROL SULFATE HFA 108 (90 BASE) MCG/ACT IN AERS: 2.0000 | INHALATION_SPRAY | Freq: Four times a day (QID) | RESPIRATORY_TRACT | Status: DC | PRN

## 2015-02-02 MED ORDER — AZITHROMYCIN 250 MG PO TABS: ORAL_TABLET | ORAL | Status: DC

## 2015-02-02 MED ORDER — FLUTICASONE PROPIONATE 50 MCG/ACT NA SUSP: 2.0000 | Freq: Every day | NASAL | Status: DC

## 2015-02-02 NOTE — Patient Instructions (Addendum)
You appear to have bronchitis. Rest hydrate and tylenol for fever. I am prescribing cough benzonatate medicine, and azithromycin antibiotic. For your nasal congestion will prescribe flonase.  For wheezing will make albuterol available.  If coughing persist then get cxr.  Follow up in 7-10 days or as needed

## 2015-02-02 NOTE — Progress Notes (Signed)
Pre visit review using our clinic review tool, if applicable. No additional management support is needed unless otherwise documented below in the visit note. 

## 2015-02-02 NOTE — Progress Notes (Signed)
Subjective:    Patient ID: Morgan Day, female    DOB: 07/27/1985, 30 y.o.   MRN: [id number]  HPI   Pt in for cough. She has had it for one [place] daily. Had faint mild nasal congestion with cough. She felt had mild rattle in chest. Then all sudden today she states cough seems resolved. No fever, no chills or sweats. Pt did have some random sneezing. States lingering illness that won't get better.(except cough just decreased today).   LMP- 3 wks ago.  Review of Systems  Constitutional: Negative for fever, chills and fatigue.  HENT: Positive for congestion and postnasal drip. Negative for ear discharge, facial swelling, nosebleeds, rhinorrhea, sinus pressure and sneezing.   Respiratory: Positive for cough. Negative for chest tightness, shortness of breath and wheezing.        When she had cough would get rare occasional mucous. Only seems resolved since this am.  Gastrointestinal: Negative for abdominal pain.  Musculoskeletal: Negative for back pain.  Neurological: Negative for headaches.  Hematological: Negative for adenopathy. Does not bruise/bleed easily.    Past Medical History  Diagnosis Date  . Asthma   . Anxiety   . Frequent UTI   . Depression   . Chronic kidney disease     Social History   Social History  . Marital Status: Single    Spouse Name: N/A  . Number of Children: N/A  . Years of Education: N/A   Occupational History  . Not on file.   Social History Main Topics  . Smoking status: Current Some Day Smoker    Types: Cigarettes  . Smokeless tobacco: Not on file  . Alcohol Use: 0.0 oz/week    0 Standard drinks or equivalent per week     Comment: social  . Drug Use: Not on file  . Sexual Activity: No   Other Topics Concern  . Not on file   Social History Narrative    Past Surgical History  Procedure Laterality Date  . Neck surgery    . Hernia repair      Family History  Problem Relation Age of Onset  . Cancer Mother   . Ataxia  Mother   . Depression Mother   . Anxiety disorder Mother     No Known Allergies  Current Outpatient Prescriptions on File Prior to Visit  Medication Sig Dispense Refill  . clonazePAM (KLONOPIN) 0.5 MG tablet Take 1 tablet (0.5 mg total) by mouth 2 (two) times daily as needed for anxiety. 12 tablet 0  . diclofenac (VOLTAREN) 75 MG EC tablet Take 1 tablet (75 mg total) by mouth 2 (two) times daily. 30 tablet 0  . levonorgestrel-ethinyl estradiol (AVIANE,ALESSE,LESSINA) 0.1-20 MG-MCG tablet Take 1 tablet by mouth daily.    . nitrofurantoin, macrocrystal-monohydrate, (MACROBID) 100 MG capsule 1 tab po q day after sex. 40 capsule 0  . SUMAtriptan (IMITREX) 100 MG tablet Take 100 mg by mouth every 2 (two) hours as needed for migraine. May repeat in 2 hours if headache persists or recurs.     No current facility-administered medications on file prior to visit.    BP 120/80 mmHg  Pulse 88  Temp(Src) 97.7 F (36.5 C) (Oral)  Ht 5\' 8"  (1.727 m)  Wt 129 lb 6.4 oz (58.695 kg)  BMI 19.68 kg/m2  SpO2 98%       Objective:   Physical Exam  General  Mental Status - Alert. General Appearance - Well groomed. Not in acute distress.  Skin  Rashes- No Rashes.  HEENT Head- Normal. Ear Auditory Canal - Left- Normal. Right - Normal.Tympanic Membrane- Left- Normal. Right- Normal. Eye Sclera/Conjunctiva- Left- Normal. Right- Normal. Nose & Sinuses Nasal Mucosa- Left-  Boggy and Congested. Right-  Boggy and  Congested.Bilateral no  maxillary and no  frontal sinus pressure. Mouth & Throat Lips: Upper Lip- Normal: no dryness, cracking, pallor, cyanosis, or vesicular eruption. Lower Lip-Normal: no dryness, cracking, pallor, cyanosis or vesicular eruption. Buccal Mucosa- Bilateral- No Aphthous ulcers. Oropharynx- No Discharge or Erythema. Tonsils: Characteristics- Bilateral- No Erythema or Congestion. Size/Enlargement- Bilateral- No enlargement. Discharge- bilateral-None.  Neck Neck- Supple. No  Masses.   Chest and Lung Exam Auscultation: Breath Sounds:-even and unlabored. Faint mild expiratory wheeze.  Cardiovascular Auscultation:Rythm- Regular, rate and rhythm. Murmurs & Other Heart Sounds:Ausculatation of the heart reveal- No Murmurs.  Lymphatic Head & Neck General Head & Neck Lymphatics: Bilateral: Description- No Localized lymphadenopathy.       Assessment & Plan:  You appear to have bronchitis. Rest hydrate and tylenol for fever. I am prescribing cough benzonatate medicine, and azithromycin antibiotic. For your nasal congestion will prescribe flonase.  For wheezing will make albuterol available.  If coughing persist then get cxr.  Follow up in 7-10 days or as needed

## 2015-02-21 ENCOUNTER — Telehealth: Payer: Self-pay | Admitting: Medical

## 2015-02-21 NOTE — Telephone Encounter (Signed)
Patient is undecided and will consult with her spouse regarding receiving her flu shot

## 2015-06-12 ENCOUNTER — Other Ambulatory Visit: Payer: Self-pay | Admitting: Medical

## 2015-06-12 NOTE — Telephone Encounter (Signed)
Refill request for Imitrex, Klonopin and Macrobid   [facility] [address]

## 2015-06-13 MED ORDER — NITROFURANTOIN MONOHYD MACRO 100 MG PO CAPS: ORAL_CAPSULE | ORAL | Status: DC

## 2015-06-13 MED ORDER — SUMATRIPTAN SUCCINATE 100 MG PO TABS: 100.0000 mg | ORAL_TABLET | ORAL | Status: AC | PRN

## 2015-06-13 MED ORDER — CLONAZEPAM 0.5 MG PO TABS: 0.5000 mg | ORAL_TABLET | Freq: Two times a day (BID) | ORAL | Status: DC | PRN

## 2015-06-13 NOTE — Telephone Encounter (Signed)
Left message for pt that Rx has been sent to pharmacy and for the pt to call back with any questions.

## 2015-06-13 NOTE — Telephone Encounter (Signed)
Pt can pick up the rx klonopin. She uses med very sparingly. I have not put her on contract. Next time she is in for script/in office may get random UDS.

## 2015-06-13 NOTE — Telephone Encounter (Signed)
Please advise on the Klonipin refill.  No UDS on file.  Last refill was 02/02/15.  Imitrex and Macrobid have been sent to the pharmacy previously.

## 2015-08-24 ENCOUNTER — Encounter: Payer: Self-pay | Admitting: Medical

## 2015-10-22 ENCOUNTER — Telehealth: Payer: Self-pay | Admitting: Medical

## 2015-10-22 NOTE — Telephone Encounter (Signed)
She needs appointment for refill. Has been about 5 months since last saw her. And due to [place] laws on controlled meds.

## 2015-10-22 NOTE — Telephone Encounter (Signed)
Relation to WJ:XBJYpt:self Call back number:[phone] [id number] [place], [place] - [address] [phone] (Phone) [phone] (Fax)     Reason for call:  Patient requesting a refill clonazePAM (KLONOPIN) 0.5 MG tablet

## 2015-10-23 NOTE — Telephone Encounter (Signed)
Appointment scheduled for Friday 10/26/15.

## 2015-10-26 ENCOUNTER — Encounter: Payer: Self-pay | Admitting: Medical

## 2015-10-26 ENCOUNTER — Ambulatory Visit (INDEPENDENT_AMBULATORY_CARE_PROVIDER_SITE_OTHER): Payer: 59 | Admitting: Medical

## 2015-10-26 VITALS — BP 120/90 | HR 97 | Temp 98.4°F | Ht 68.0 in | Wt 123.2 lb

## 2015-10-26 DIAGNOSIS — Z23 Encounter for immunization: Secondary | ICD-10-CM

## 2015-10-26 DIAGNOSIS — F419 Anxiety disorder, unspecified: Secondary | ICD-10-CM | POA: Diagnosis not present

## 2015-10-26 MED ORDER — CLONAZEPAM 0.5 MG PO TABS: 0.5000 mg | ORAL_TABLET | Freq: Two times a day (BID) | ORAL | 0 refills | Status: DC | PRN

## 2015-10-26 NOTE — Progress Notes (Signed)
Pre visit review using our clinic tool,if applicable. No additional management support is needed unless otherwise documented below in the visit note.  

## 2015-10-26 NOTE — Progress Notes (Signed)
Subjective:    Patient ID: Morgan Day, female    DOB: 05/15/1985, 30 y.o.   MRN: [id number]  HPI   Pt in for follow up.  Pt has history of anxiety in past. 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 She states she is busier in fall and winter. With jobs and right before shoots will get anxious. Pt also will anxious taking care of her mom. Her mom has spinal cerebellar ataxia. Pt states no treatment now. Various siblings have.  So at times she gets acutely stressed with taking care of mom.  Pt most recent rx was for 20 clonazepam in May. So she appears to use this very rarely.   Pt takes flu vaccinations.   Pt on review takes one help oil capsule po am and pm daily.  LMP- past Saturday. She is on ocp.    Review of Systems  Constitutional: Negative for chills, fatigue and fever.  HENT: Negative for congestion, drooling, ear discharge, facial swelling and mouth sores.   Respiratory: Negative for cough, chest tightness, shortness of breath and wheezing.   Cardiovascular: Negative for chest pain and palpitations.  Gastrointestinal: Negative for abdominal pain.  Musculoskeletal: Negative for back pain and joint swelling.  Skin: Negative for rash.  Neurological: Negative for dizziness and headaches.  Hematological: Negative for adenopathy. Does not bruise/bleed easily.  Psychiatric/Behavioral: Negative for behavioral problems and confusion. The patient is nervous/anxious.     Past Medical History:  Diagnosis Date  . Anxiety    DR CYNTHIA WHITE FOR MED  . Anxiety   . Asthma    exercise asthma  . Asthma   . Chronic kidney disease AGE 34   REFLUX  . Chronic kidney disease   . Depression AS  TEEN  . Depression   . Frequent UTI   . Infection    FREQUENT UTI A CHILD  . PONV (postoperative nausea and vomiting)   . Urinary reflux    as child     Social History   Social History  . Marital status: Single    Spouse name: MARIO  . Number of children: 1  . 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 Smoking status: Current Some Day Smoker    Types: Cigarettes  . Smokeless tobacco: Not on file  . Alcohol use 0.0 oz/week     Comment: social  . Drug use: Unknown  . Sexual activity: No     Comment: D/C'D 06/2011   Other Topics Concern  . Not on file   Social History Narrative   ** Merged History Encounter **        Past Surgical History:  Procedure Laterality Date  . HERNIA REPAIR    . MOUTH SURGERY    . NECK SURGERY    . THYROGLOSSAL DUCT CYST  05/09/2011  . THYROGLOSSAL DUCT CYST  05/09/2011   Procedure: THYROGLOSSAL DUCT CYST;  Surgeon: Osborn Cohoavid Shoemaker, MD;  [facility];  Service: ENT;  Laterality: N/A;  . URINARY SURGERY     for reflux   as child    Family History  Problem Relation Age of Onset  . Cancer Mother   . Ataxia Mother   . Depression Mother   . Anxiety disorder Mother   . Other Mother     NAUSEA WITH  NARCOTICS  . Other Sister     NAUSEA WITH NARCOTICS    Allergies  Allergen  Reactions  . Other     Narcotic pain medicine she reports makes her nauseated    Current Outpatient Prescriptions on File Prior to Visit  Medication Sig Dispense Refill  . albuterol (PROVENTIL HFA;VENTOLIN HFA) 108 (90 Base) MCG/ACT inhaler Inhale 2 puffs into the lungs every 6 (six) hours as needed for wheezing or shortness of breath. 1 Inhaler 0  . cholecalciferol (VITAMIN D) 1000 UNITS tablet Take 1,000 Units by mouth daily.    . clonazePAM (KLONOPIN) 0.5 MG tablet Take 0.5 mg by mouth 2 (two) times daily as needed. For anxiety    . Levonorgestrel-Ethinyl Estrad (ALTAVERA PO) Take 1 tablet by mouth daily.    . nitrofurantoin, macrocrystal-monohydrate, (MACROBID) 100 MG capsule 1 tab po q day after sex. 30 capsule 1  . SUMAtriptan (IMITREX) 100 MG tablet Take 1 tablet (100 mg total) by mouth every 2 (two) hours as needed for migraine. May repeat in 2 hours if headache persists or  recurs. 12 tablet 0  . calcium-vitamin D (OSCAL WITH D) 250-125 MG-UNIT per tablet Take 1 tablet by mouth daily.    Marland Kitchen co-enzyme Q-10 30 MG capsule Take 30 mg by mouth 3 (three) times daily.    . diclofenac (VOLTAREN) 75 MG EC tablet Take 1 tablet (75 mg total) by mouth 2 (two) times daily. (Patient not taking: Reported on 10/26/2015) 30 tablet 0  . fish oil-omega-3 fatty acids 1000 MG capsule Take 3 g by mouth daily.    . fluticasone (FLONASE) 50 MCG/ACT nasal spray Place 2 sprays into both nostrils daily. (Patient not taking: Reported on 10/26/2015) 16 g 1  . Multiple Vitamin (MULTIVITAMIN) capsule Take 3 capsules by mouth daily. greensourse     No current facility-administered medications on file prior to visit.     BP 120/90   Pulse 97   Temp 98.4 F (36.9 C) (Oral)   Ht 5\' 8"  (1.727 m)   Wt 123 lb 3.2 oz (55.9 kg)   LMP 10/21/2015   SpO2 99%   Breastfeeding? No   BMI 18.73 kg/m       Objective:   Physical Exam  General Mental Status- Alert. General Appearance- Not in acute distress.   . Chest and Lung Exam Auscultation: Breath Sounds:-Normal.  Cardiovascular Auscultation:Rythm- Regular. Murmurs & Other Heart Sounds:Auscultation of the heart reveals- No Murmurs.  Neurologic Cranial Nerve exam:- CN III-XII intact(No nystagmus), symmetric smile. Strength:- 5/5 equal and symmetric strength both upper and lower extremities.      Assessment & Plan:  You appear to control you anxiety well with intermittent flares of moderate to severe anxiety. Will go ahead and rx clonazepam since you have sporadic use.   Decided best to get UDS and get contract per [place] law. Continue to use as sparingly as possible.  We gave you flu vaccine today.  Follow up 3-6 months or as needed  Mandolin Falwell, Ramon Dredge, [employer]

## 2015-10-26 NOTE — Patient Instructions (Addendum)
You appear to control you anxiety well with intermittent flares of moderate to severe anxiety. Will go ahead and rx clonazepam since you have sporadic use.   Decided best to get UDS and get contract per [place] law. Continue to use as sparingly as possible.  We gave you flu vaccine today.  Follow up 3-6 months or as needed

## 2016-07-15 ENCOUNTER — Telehealth: Payer: Self-pay | Admitting: Medical

## 2016-07-15 ENCOUNTER — Other Ambulatory Visit: Payer: Self-pay | Admitting: Family Medicine

## 2016-07-15 MED ORDER — NITROFURANTOIN MONOHYD MACRO 100 MG PO CAPS: ORAL_CAPSULE | ORAL | 1 refills | Status: DC

## 2016-07-15 NOTE — Telephone Encounter (Signed)
I sent Macrobid to her pharmacy please let her know Ramon Dredgedward has been out of office so I have refilled.

## 2016-07-15 NOTE — Telephone Encounter (Signed)
Caller name: Victorino DikeJennifer Forlenza Relationship to patient: self Can be reached: [phone] [id number] [place], [place] - [address]  Reason for call: pt has recurrent uti due to kidney reflux disease. She get uti easily if she has sex etc. Pt states she called after hours/triage on Friday 5:30pm and they said they would send msg to provider. Pt has uti now and no meds.

## 2016-07-16 NOTE — Telephone Encounter (Signed)
Pt notified of instructions and verbalized understanding. 

## 2017-01-15 DIAGNOSIS — R300 Dysuria: Secondary | ICD-10-CM | POA: Diagnosis not present

## 2017-01-15 DIAGNOSIS — Z01411 Encounter for gynecological examination (general) (routine) with abnormal findings: Secondary | ICD-10-CM | POA: Diagnosis not present

## 2017-04-16 ENCOUNTER — Telehealth: Payer: Self-pay | Admitting: Medical

## 2017-04-16 NOTE — Telephone Encounter (Signed)
Copied from CRM [phone]. Topic: Quick Communication - See Telephone Encounter >> Apr 16, 2017  1:53 PM Guinevere FerrariMorris, Sie Formisano E, NT wrote: CRM for notification. See Telephone encounter for: 04/16/17. Patient called and said she is going on a cruise in a few weeks and wanted to see if the doctor could write her a prescription for motion sickness patch. [facility]  [id number], [place] - [address] [phone] (Phone) [phone] (Fax)

## 2017-04-16 NOTE — Telephone Encounter (Signed)
Open to review.  

## 2017-04-16 NOTE — Telephone Encounter (Signed)
I have not seen pt since 10-2015. I was going to rx scopolamine but  epicbrought up pregnancy contraindication. Have not seen her in such a long time?? When was her last menses??Is she on oral contraceptive. Need to know this before writing rx scopolamine. But if I do write med advise her going forward would need to come in some time as I just can't be taking med request occasionally without seeing her somewhat regularly at least once a year.

## 2017-04-20 NOTE — Telephone Encounter (Signed)
Patient states her LMP was approximately 2 weeks ago. She is taking oral contraceptive Sronyx. Advised patient to come in for follow up in future.

## 2017-05-21 ENCOUNTER — Ambulatory Visit: Payer: BLUE CROSS/BLUE SHIELD | Admitting: Medical

## 2017-05-21 ENCOUNTER — Encounter: Payer: Self-pay | Admitting: Medical

## 2017-05-21 VITALS — BP 128/82 | HR 109 | Resp 16 | Ht 68.0 in | Wt 120.0 lb

## 2017-05-21 DIAGNOSIS — J029 Acute pharyngitis, unspecified: Secondary | ICD-10-CM

## 2017-05-21 DIAGNOSIS — H938X3 Other specified disorders of ear, bilateral: Secondary | ICD-10-CM

## 2017-05-21 DIAGNOSIS — H9203 Otalgia, bilateral: Secondary | ICD-10-CM

## 2017-05-21 MED ORDER — MELOXICAM 7.5 MG PO TABS: ORAL_TABLET | ORAL | 0 refills | Status: DC

## 2017-05-21 MED ORDER — AZITHROMYCIN 250 MG PO TABS: ORAL_TABLET | ORAL | 0 refills | Status: DC

## 2017-05-21 MED ORDER — FLUTICASONE PROPIONATE 50 MCG/ACT NA SUSP: 2.0000 | Freq: Every day | NASAL | 1 refills | Status: DC

## 2017-05-21 NOTE — Patient Instructions (Addendum)
Your strep test was negative. However, your physical exam and clinical presentation is suspicious for strep and it is important to note that rapid strep test can be falsely negative. So I am going to give you azithromycin antibiotic today based on your exam and clinical presentation.  For ear pain/problable pressure will rx flonase.  Rest hydrate, tylenol for fever, and warm salt water gargles.   For throat pain prescribed meloxicam  Follow up in 7 days or as needed.

## 2017-05-21 NOTE — Progress Notes (Signed)
Subjective:    Patient ID: Morgan Day, female    DOB: September 01, 1985, 32 y.o.   MRN: [id number]  HPI   Pt in for st. She states that her throat is severe for about 2 weeks. Hurts to swallow.  Also some rt ear pain for about 2-3 days. No fever, no chills, or sweats. No swollen lymph nodes per pt. On review denying obvious allergy signs or symptoms.   She update me learning how to deal with anxiety. No longer on benzodiazepine.  Also update me that had severe pinkeye about 3 weeks. Went to UC in [place] and drops helped. A lot. Finally got better.  LMP- 2 weeks ago.   Review of Systems  Constitutional: Negative for chills, fatigue and fever.  HENT: Positive for ear pain and sore throat. Negative for congestion, drooling, ear discharge, nosebleeds, postnasal drip, sinus pressure and sinus pain.        Ear pressure/pain bilateral.  Eyes: Negative for photophobia, pain, discharge and visual disturbance.  Respiratory: Negative for cough, chest tightness, shortness of breath and wheezing.   Cardiovascular: Negative for chest pain and palpitations.  Gastrointestinal: Negative for abdominal distention and abdominal pain.  Musculoskeletal: Negative for back pain.  Skin: Negative for pallor and rash.  Neurological: Negative for dizziness and headaches.  Hematological: Negative for adenopathy. Does not bruise/bleed easily.  Psychiatric/Behavioral: Negative for behavioral problems and confusion.     Past Medical History:  Diagnosis Date  . Anxiety    DR CYNTHIA WHITE FOR MED  . Anxiety   . Asthma    exercise asthma  . Asthma   . Chronic kidney disease AGE 48   REFLUX  . Chronic kidney disease   . Depression AS  TEEN  . Depression   . Frequent UTI   . Infection    FREQUENT UTI A CHILD  . PONV (postoperative nausea and vomiting)   . Urinary reflux    as child     Social History   Socioeconomic History  . Marital status: Single    Spouse name: MARIO  . Number of  children: 1  . Years of education: 74  . Highest education level: Not on file  Occupational History  . Occupation: [employer]  Social Needs  . Financial resource strain: Not on file  . Food insecurity:    Worry: Not on file    Inability: Not on file  . Transportation needs:    Medical: Not on file    Non-medical: Not on file  Tobacco Use  . Smoking status: Former Smoker    Types: Cigarettes  Substance and Sexual Activity  . Alcohol use: Yes    Alcohol/week: 0.0 oz    Comment: social  . Drug use: Not on file  . Sexual activity: Never    Partners: Male    Birth control/protection: Pill    Comment: D/C'D 06/2011  Lifestyle  . Physical activity:    Days per week: Not on file    Minutes per session: Not on file  . Stress: Not on file  Relationships  . Social connections:    Talks on phone: Not on file    Gets together: Not on file    Attends religious service: Not on file    Active member of club or organization: Not on file    Attends meetings of clubs or organizations: Not on file    Relationship status: Not on file  . Intimate partner violence:    Fear of  current or ex partner: Not on file    Emotionally abused: Not on file    Physically abused: Not on file    Forced sexual activity: Not on file  Other Topics Concern  . Not on file  Social History Narrative   ** Merged History Encounter **        Past Surgical History:  Procedure Laterality Date  . HERNIA REPAIR    . MOUTH SURGERY    . NECK SURGERY    . THYROGLOSSAL DUCT CYST  05/09/2011  . THYROGLOSSAL DUCT CYST  05/09/2011   Procedure: THYROGLOSSAL DUCT CYST;  Surgeon: Osborn Coho, MD;  Location[facility] OR;  Service: ENT;  Laterality: N/A;  . URINARY SURGERY     for reflux   as child    Family History  Problem Relation Age of Onset  . Cancer Mother   . Ataxia Mother   . Depression Mother   . Anxiety disorder Mother   . Other Mother        NAUSEA WITH  NARCOTICS  . Other Sister        NAUSEA WITH  NARCOTICS    Allergies  Allergen Reactions  . Other     Narcotic pain medicine she reports makes her nauseated    Current Outpatient Medications on File Prior to Visit  Medication Sig Dispense Refill  . Acetylcarnitine HCl (ACETYL L-CARNITINE PO) Take by mouth 2 (two) times daily.    Marland Kitchen albuterol (PROVENTIL HFA;VENTOLIN HFA) 108 (90 Base) MCG/ACT inhaler Inhale 2 puffs into the lungs every 6 (six) hours as needed for wheezing or shortness of breath. 1 Inhaler 0  . Alpha-Lipoic Acid (LIPOIC ACID PO) Take by mouth.    . calcium-vitamin D (OSCAL WITH D) 250-125 MG-UNIT per tablet Take 1 tablet by mouth daily.    . cholecalciferol (VITAMIN D) 1000 UNITS tablet Take 1,000 Units by mouth daily.    Marland Kitchen co-enzyme Q-10 30 MG capsule Take 30 mg by mouth 3 (three) times daily.    . fish oil-omega-3 fatty acids 1000 MG capsule Take 3 g by mouth daily.    . Levonorgestrel-Ethinyl Estrad (ALTAVERA PO) Take 1 tablet by mouth daily.    . LUTEIN-ZEAXANTHIN PO Take by mouth.    . Multiple Vitamin (MULTIVITAMIN) capsule Take 3 capsules by mouth daily. greensourse    . nitrofurantoin, macrocrystal-monohydrate, (MACROBID) 100 MG capsule 1 tab po q day after sex. 30 capsule 1  . UNABLE TO FIND Med Name: Corvalen 2tsp daily    . clonazePAM (KLONOPIN) 0.5 MG tablet Take 1 tablet (0.5 mg total) by mouth 2 (two) times daily as needed. For anxiety (Patient not taking: Reported on 05/21/2017) 30 tablet 0  . SUMAtriptan (IMITREX) 100 MG tablet Take 1 tablet (100 mg total) by mouth every 2 (two) hours as needed for migraine. May repeat in 2 hours if headache persists or recurs. (Patient not taking: Reported on 05/21/2017) 12 tablet 0   No current facility-administered medications on file prior to visit.     BP 128/82 (BP Location: Right Arm, Patient Position: Sitting, Cuff Size: Normal)   Pulse (!) 109   Resp 16   Ht  (1.727 m)   Wt 120 lb (54.4 kg)   SpO2 98%   BMI 18.25 kg/m      Objective:   Physical  Exam  General  Mental Status - Alert. General Appearance - Well groomed. Not in acute distress.  Skin Rashes- No Rashes.  HEENT Head- Normal.  Ear Auditory Canal - Left- Normal. Right - Normal.Tympanic Membrane- Left- Normal. Right- Normal. Eye Sclera/Conjunctiva- Left- Normal. Right- Normal. Nose & Sinuses Nasal Mucosa- Left-  Boggy and Congested. Right-  Boggy and  Congested.Bilateral  No maxillary and no  frontal sinus pressure. Mouth & Throat Lips: Upper Lip- Normal: no dryness, cracking, pallor, cyanosis, or vesicular eruption. Lower Lip-Normal: no dryness, cracking, pallor, cyanosis or vesicular eruption. Buccal Mucosa- Bilateral- No Aphthous ulcers. Oropharynx- No Discharge or Erythema. Tonsils: Characteristics- Bilateral- moderate bright   Erythema. Size/Enlargement- Bilateral- No enlargement. Discharge- bilateral-None.  Neck Neck- Supple. No Masses.   Chest and Lung Exam Auscultation: Breath Sounds:-Clear even and unlabored.  Cardiovascular Auscultation:Rythm- Regular, rate and rhythm. Murmurs & Other Heart Sounds:Ausculatation of the heart reveal- No Murmurs.  Lymphatic Head & Neck General Head & Neck Lymphatics: Bilateral: Description- no obvious submandibular node enlargement.       Assessment & Plan:  Your strep test was negative. However, your physical exam and clinical presentation is suspicious for strep and it is important to note that rapid strep test can be falsely negative. So I am going to give you azithromycin antibiotic today based on your exam and clinical presentation.  For ear pain/problable pressure will rx flonase.  Rest hydrate, tylenol for fever, and warm salt water gargles.   For throat pain prescribed meloxicam.  Follow up in 7 days or as needed.   Esperanza Richters, PA-C

## 2017-09-15 ENCOUNTER — Encounter: Payer: Self-pay | Admitting: Medical

## 2017-09-15 ENCOUNTER — Ambulatory Visit (HOSPITAL_BASED_OUTPATIENT_CLINIC_OR_DEPARTMENT_OTHER)
Admission: RE | Admit: 2017-09-15 | Discharge: 2017-09-15 | Disposition: A | Discharge status: Home / Self Care | Payer: BLUE CROSS/BLUE SHIELD | Source: Ambulatory Visit | Attending: Medical | Admitting: Medical

## 2017-09-15 ENCOUNTER — Ambulatory Visit: Payer: BLUE CROSS/BLUE SHIELD | Admitting: Medical

## 2017-09-15 VITALS — BP 120/70 | HR 111 | Temp 98.3°F | Resp 16 | Ht 68.0 in | Wt 123.2 lb

## 2017-09-15 DIAGNOSIS — M79604 Pain in right leg: Secondary | ICD-10-CM | POA: Diagnosis not present

## 2017-09-15 DIAGNOSIS — F419 Anxiety disorder, unspecified: Secondary | ICD-10-CM | POA: Diagnosis not present

## 2017-09-15 DIAGNOSIS — R0789 Other chest pain: Secondary | ICD-10-CM

## 2017-09-15 DIAGNOSIS — Z87440 Personal history of urinary (tract) infections: Secondary | ICD-10-CM

## 2017-09-15 DIAGNOSIS — R0781 Pleurodynia: Secondary | ICD-10-CM | POA: Insufficient documentation

## 2017-09-15 DIAGNOSIS — R079 Chest pain, unspecified: Secondary | ICD-10-CM | POA: Diagnosis not present

## 2017-09-15 DIAGNOSIS — M79661 Pain in right lower leg: Secondary | ICD-10-CM | POA: Diagnosis not present

## 2017-09-15 DIAGNOSIS — M79662 Pain in left lower leg: Secondary | ICD-10-CM | POA: Diagnosis not present

## 2017-09-15 DIAGNOSIS — M79605 Pain in left leg: Secondary | ICD-10-CM

## 2017-09-15 MED ORDER — ALPRAZOLAM 0.5 MG PO TABS: 0.5000 mg | ORAL_TABLET | Freq: Two times a day (BID) | ORAL | 0 refills | Status: DC | PRN

## 2017-09-15 MED ORDER — NITROFURANTOIN MONOHYD MACRO 100 MG PO CAPS: ORAL_CAPSULE | ORAL | 1 refills | Status: DC

## 2017-09-15 NOTE — Progress Notes (Signed)
Subjective:    Patient ID: Morgan Day, female    DOB: 1985-09-20, 32 y.o.   MRN: [id number]  HPI  Pt in reporting some random calf pain both sides. Pt state random calf pain on and off. Pt states nerve or vein pain for a week or two. But she states her sister diagnosed with dvt begninig of summer. Her sister got eventual PE. Pt not sure if her sister had cause found on lab work. This causes concern for patient.  Pt also states on Sunday felt transient chest pain. That pain last for about an hours. Some pain on deep breathing. Pt has little cough past few day. Pt was former smoker. No fever,no chills or sweats. Pain went away on Sunday night in chest and on breathing. No reoccurence.  Last night she had some random upper outer chest pain last night. Went away and did not return.  LMP-  2 weeks ago.  Pt has history of anxiety so the above causing her concerns. She has not required medicine for anxiety for some time but recently she has became more anxious. Request limited number of xanax. She states has been trying to control anxiety without meds.  Pt has no wheezing, fever, or chills.  Also history of chronic recurrent uti after sex. Positive cultures in past. She request refill of her macrobid to prevent infection.     Review of Systems  Respiratory:       History of pleuritic pain.  Cardiovascular: Negative for chest pain and palpitations.       No current pain but atypical pain last week left upper chest and last night mild atypical rt upper chest discomfort. No pain presently.  Gastrointestinal: Negative for abdominal pain.  Genitourinary: Negative for decreased urine volume, dysuria and frequency.  Musculoskeletal: Negative for back pain.       Calf pain.  Skin: Negative for rash.  Psychiatric/Behavioral: Negative for agitation, behavioral problems, decreased concentration and sleep disturbance. The patient is nervous/anxious.    Past Medical History:  Diagnosis  Date  . Anxiety    DR CYNTHIA WHITE FOR MED  . Anxiety   . Asthma    exercise asthma  . Asthma   . Chronic kidney disease AGE 15   REFLUX  . Chronic kidney disease   . Depression AS  TEEN  . Depression   . Frequent UTI   . Infection    FREQUENT UTI A CHILD  . PONV (postoperative nausea and vomiting)   . Urinary reflux    as child     Social History   Socioeconomic History  . Marital status: Single    Spouse name: MARIO  . Number of children: 1  . Years of education: 77  . Highest education level: Not on file  Occupational History  . Occupation: [employer]  Social Needs  . Financial resource strain: Not on file  . Food insecurity:    Worry: Not on file    Inability: Not on file  . Transportation needs:    Medical: Not on file    Non-medical: Not on file  Tobacco Use  . Smoking status: Former Smoker    Types: Cigarettes  . Smokeless tobacco: Never Used  Substance and Sexual Activity  . Alcohol use: Yes    Alcohol/week: 0.0 standard drinks    Comment: social  . Drug use: Not on file  . Sexual activity: Never    Partners: Male    Birth control/protection: Pill  Comment: D/C'D 06/2011  Lifestyle  . Physical activity:    Days per week: Not on file    Minutes per session: Not on file  . Stress: Not on file  Relationships  . Social connections:    Talks on phone: Not on file    Gets together: Not on file    Attends religious service: Not on file    Active member of club or organization: Not on file    Attends meetings of clubs or organizations: Not on file    Relationship status: Not on file  . Intimate partner violence:    Fear of current or ex partner: Not on file    Emotionally abused: Not on file    Physically abused: Not on file    Forced sexual activity: Not on file  Other Topics Concern  . Not on file  Social History Narrative   ** Merged History Encounter **        Past Surgical History:  Procedure Laterality Date  . HERNIA REPAIR    .  MOUTH SURGERY    . NECK SURGERY    . THYROGLOSSAL DUCT CYST  05/09/2011  . THYROGLOSSAL DUCT CYST  05/09/2011   Procedure: THYROGLOSSAL DUCT CYST;  Surgeon: Osborn Coho, MD;  Location: [facility] OR;  Service: ENT;  Laterality: N/A;  . URINARY SURGERY     for reflux   as child    Family History  Problem Relation Age of Onset  . Cancer Mother   . Ataxia Mother   . Depression Mother   . Anxiety disorder Mother   . Other Mother        NAUSEA WITH  NARCOTICS  . Other Sister        NAUSEA WITH NARCOTICS    Allergies  Allergen Reactions  . Other     Narcotic pain medicine she reports makes her nauseated    Current Outpatient Medications on File Prior to Visit  Medication Sig Dispense Refill  . Acetylcarnitine HCl (ACETYL L-CARNITINE PO) Take by mouth 2 (two) times daily.    Marland Kitchen albuterol (PROVENTIL HFA;VENTOLIN HFA) 108 (90 Base) MCG/ACT inhaler Inhale 2 puffs into the lungs every 6 (six) hours as needed for wheezing or shortness of breath. 1 Inhaler 0  . Alpha-Lipoic Acid (LIPOIC ACID PO) Take by mouth.    Marland Kitchen azithromycin (ZITHROMAX) 250 MG tablet Take 2 tablets by mouth on day 1, followed by 1 tablet by mouth daily for 4 days. 6 tablet 0  . calcium-vitamin D (OSCAL WITH D) 250-125 MG-UNIT per tablet Take 1 tablet by mouth daily.    . cholecalciferol (VITAMIN D) 1000 UNITS tablet Take 1,000 Units by mouth daily.    . clonazePAM (KLONOPIN) 0.5 MG tablet Take 1 tablet (0.5 mg total) by mouth 2 (two) times daily as needed. For anxiety 30 tablet 0  . co-enzyme Q-10 30 MG capsule Take 30 mg by mouth 3 (three) times daily.    . fish oil-omega-3 fatty acids 1000 MG capsule Take 3 g by mouth daily.    . fluticasone (FLONASE) 50 MCG/ACT nasal spray Place 2 sprays into both nostrils daily. 16 g 1  . Levonorgestrel-Ethinyl Estrad (ALTAVERA PO) Take 1 tablet by mouth daily.    . LUTEIN-ZEAXANTHIN PO Take by mouth.    . meloxicam (MOBIC) 7.5 MG tablet 1-2 tab po q day 14 tablet 0  . Multiple Vitamin  (MULTIVITAMIN) capsule Take 3 capsules by mouth daily. greensourse    . SUMAtriptan (IMITREX)  100 MG tablet Take 1 tablet (100 mg total) by mouth every 2 (two) hours as needed for migraine. May repeat in 2 hours if headache persists or recurs. 12 tablet 0  . UNABLE TO FIND Med Name: Corvalen 2tsp daily     No current facility-administered medications on file prior to visit.     BP 120/70   Pulse (!) 111   Temp 98.3 F (36.8 C) (Oral)   Resp 16   Ht 5\' 8"  (1.727 m)   Wt 123 lb 3.2 oz (55.9 kg)   SpO2 98%   BMI 18.73 kg/m       Objective:   Physical Exam  General Mental Status- Alert. General Appearance- Not in acute distress.   Skin General: Color- Normal Color. Moisture- Normal Moisture.  Neck Carotid Arteries- Normal color. Moisture- Normal Moisture. No carotid bruits. No JVD.  Chest and Lung Exam Auscultation: Breath Sounds:-Normal.  Cardiovascular Auscultation:Rythm- Regular. Murmurs & Other Heart Sounds:Auscultation of the heart reveals- No Murmurs.  Abdomen Inspection:-Inspeection Normal. Palpation/Percussion:Note:No mass. Palpation and Percussion of the abdomen reveal- Non Tender, Non Distended + BS, no rebound or guarding.    Neurologic Cranial Nerve exam:- CN III-XII intact(No nystagmus), symmetric smile. Strength:- 5/5 equal and symmetric strength both upper and lower extremities.  Bilateral lower ext- mid calf pain mild on palpation. calves not swollen. Symmetric. Rt side speculates faint mild popliteal region discomfort on homan testing.    Assessment & Plan:  For your recent atypical chest pain we did EKG today.  It showed sinus tachycardic  but mildly tachycardic.  I think increased heart rate is related to your obvious anxiety flare.  Will go ahead and get a chest x-ray to evaluate further your recent atypical pain.  Your oxygen sat level is 98%.  For your anxiety we will give limited number of Xanax No. 10 tablets to use if needed as instructed  on the prescription.  Please go down and get the US of both lower ext scheduled today. You can get cxr later today as well.  I now you mentioned you have to be somewhere by 1:30 but want the studies done today.  If signs and symptoms worsen or change then ED evaluation.  For hx of chronic recurrent uti after sex refilled your macrobid.  Follow up as date to be determined.  40 minutes spent with patient. 50% of time spent on counseling on atypical chest pain, bilateral calf  pain, recent anxiety and chronic recurrent uti. Need to order ekg, cxr and US. Counseled patient in context that her sister recently had dvt and PE. Explained to pt please get studies scheduled today although she did have to pick up one child at 1:30. So counseled on getting studies done today.  Esperanza RichtersEdward Jamol Ginyard, PA-C

## 2017-09-15 NOTE — Patient Instructions (Addendum)
For your recent atypical chest pain we did EKG today.  It showed sinus tachycardic  but mildly tachycardic.  I think increased heart rate is related to your obvious anxiety flare/state.  Will go ahead and get a chest x-ray to evaluate further your recent atypical pain.  Your oxygen sat level is 98%.  For your anxiety we will give limited number of Xanax No. 10 tablets to use if needed as instructed on the prescription.  Please go down and get the US of both lower ext scheduled today. You can get cxr later today as well.  I now you mentioned you have to be somewhere by 1:30 but want the studies done today. So please get scheduled to come back later today  If signs and symptoms worsen or change then ED evaluation.  For hx of chronic recurrent uti after sex refilled your macrobid.  Follow up as date to be determined.

## 2018-02-01 ENCOUNTER — Encounter: Payer: Self-pay | Admitting: Medical

## 2018-02-01 ENCOUNTER — Ambulatory Visit: Payer: BLUE CROSS/BLUE SHIELD | Admitting: Medical

## 2018-02-01 VITALS — BP 136/86 | HR 110 | Temp 98.4°F | Resp 16 | Ht 68.0 in | Wt 116.2 lb

## 2018-02-01 DIAGNOSIS — J40 Bronchitis, not specified as acute or chronic: Secondary | ICD-10-CM | POA: Diagnosis not present

## 2018-02-01 DIAGNOSIS — T753XXA Motion sickness, initial encounter: Secondary | ICD-10-CM

## 2018-02-01 MED ORDER — BENZONATATE 100 MG PO CAPS: 100.0000 mg | ORAL_CAPSULE | Freq: Three times a day (TID) | ORAL | 0 refills | Status: DC | PRN

## 2018-02-01 MED ORDER — SCOPOLAMINE 1 MG/3DAYS TD PT72: 1.0000 | MEDICATED_PATCH | TRANSDERMAL | 0 refills | Status: DC

## 2018-02-01 MED ORDER — AZITHROMYCIN 250 MG PO TABS: ORAL_TABLET | ORAL | 0 refills | Status: DC

## 2018-02-01 NOTE — Progress Notes (Signed)
Subjective:    Patient ID: Morgan MediateJennifer McAdams Day, female    DOB: 09/09/1985, 33 y.o.   MRN: [id number]  HPI  Pt in states she had uri type symptoms about 2 weeks ago. Over past week feels chest congestion and feels like she can't cough up mucus. She states just not getting better. Occasionally will get productive cough. Pt states maybe fever and chills. Regarding chills points out always gets chills easy since thin. Pt not smoker but former smoker. Stopped 2 years ago.  Pt is going on a cruise and she wants to get motion sickness meds. States when younger motion sickness riding in cars. Pt is going on cruise Feb 26, 2017.   Review of Systems  Constitutional: Negative for chills, fatigue and fever.  Respiratory: Positive for cough. Negative for chest tightness, shortness of breath and wheezing.        Chest congestion. Productive cough.  Cardiovascular: Negative for chest pain and palpitations.  Gastrointestinal: Negative for abdominal pain, constipation, nausea and vomiting.  Musculoskeletal: Negative for back pain, joint swelling and neck stiffness.  Skin: Negative for rash.  Neurological: Negative for dizziness, syncope, weakness, light-headedness and headaches.       Pt family has spino cerebellar ataxia. Pt mom, grand mom, brother and aunt has or had disease. Pt has not been evaluated. Pt had been offered in the past referral for work up. [facility] of [facility] and evaluation.   Pt thinks work up for condition not covered under insurance. She still declines referral.  She has been thinking of doing study. [place] up. And if positive would take about 2 years to treat. She would have to travel to [place] every week up for 2 years. So presently not wanting to do study.  Hematological: Negative for adenopathy. Does not bruise/bleed easily.  Psychiatric/Behavioral: Negative for behavioral problems and decreased concentration.    Past Medical  History:  Diagnosis Date  . Anxiety    DR CYNTHIA WHITE FOR MED  . Anxiety   . Asthma    exercise asthma  . Asthma   . Chronic kidney disease AGE 65   REFLUX  . Chronic kidney disease   . Depression AS  TEEN  . Depression   . Frequent UTI   . Infection    FREQUENT UTI A CHILD  . PONV (postoperative nausea and vomiting)   . Urinary reflux    as child     Social History   Socioeconomic History  . Marital status: Married    Spouse name: MARIO  . Number of children: 1  . Years of education: 6213  . Highest education level: Not on file  Occupational History  . Occupation: [employer]  Social Needs  . Financial resource strain: Not on file  . Food insecurity:    Worry: Not on file    Inability: Not on file  . Transportation needs:    Medical: Not on file    Non-medical: Not on file  Tobacco Use  . Smoking status: Former Smoker    Types: Cigarettes  . Smokeless tobacco: Never Used  Substance and Sexual Activity  . Alcohol use: Yes    Alcohol/week: 0.0 standard drinks    Comment: social  . Drug use: Not on file  . Sexual activity: Never    Partners: Male    Birth control/protection: Pill    Comment: D/C'D 06/2011  Lifestyle  . Physical activity:  Days per week: Not on file    Minutes per session: Not on file  . Stress: Not on file  Relationships  . Social connections:    Talks on phone: Not on file    Gets together: Not on file    Attends religious service: Not on file    Active member of club or organization: Not on file    Attends meetings of clubs or organizations: Not on file    Relationship status: Not on file  . Intimate partner violence:    Fear of current or ex partner: Not on file    Emotionally abused: Not on file    Physically abused: Not on file    Forced sexual activity: Not on file  Other Topics Concern  . Not on file  Social History Narrative   ** Merged History Encounter **        Past Surgical History:  Procedure Laterality Date  .  HERNIA REPAIR    . MOUTH SURGERY    . NECK SURGERY    . THYROGLOSSAL DUCT CYST  05/09/2011  . THYROGLOSSAL DUCT CYST  05/09/2011   Procedure: THYROGLOSSAL DUCT CYST;  Surgeon: Osborn Cohoavid Shoemaker, MD;  [facility];  Service: ENT;  Laterality: N/A;  . URINARY SURGERY     for reflux   as child    Family History  Problem Relation Age of Onset  . Cancer Mother   . Ataxia Mother   . Depression Mother   . Anxiety disorder Mother   . Other Mother        NAUSEA WITH  NARCOTICS  . Other Sister        NAUSEA WITH NARCOTICS    Allergies  Allergen Reactions  . Other     Narcotic pain medicine she reports makes her nauseated    Current Outpatient Medications on File Prior to Visit  Medication Sig Dispense Refill  . Acetylcarnitine HCl (ACETYL L-CARNITINE PO) Take by mouth 2 (two) times daily.    Marland Kitchen. albuterol (PROVENTIL HFA;VENTOLIN HFA) 108 (90 Base) MCG/ACT inhaler Inhale 2 puffs into the lungs every 6 (six) hours as needed for wheezing or shortness of breath. 1 Inhaler 0  . Alpha-Lipoic Acid (LIPOIC ACID PO) Take by mouth.    . ALPRAZolam (XANAX) 0.5 MG tablet Take 1 tablet (0.5 mg total) by mouth 2 (two) times daily as needed for anxiety. 10 tablet 0  . azithromycin (ZITHROMAX) 250 MG tablet Take 2 tablets by mouth on day 1, followed by 1 tablet by mouth daily for 4 days. 6 tablet 0  . calcium-vitamin D (OSCAL WITH D) 250-125 MG-UNIT per tablet Take 1 tablet by mouth daily.    . cholecalciferol (VITAMIN D) 1000 UNITS tablet Take 1,000 Units by mouth daily.    . clonazePAM (KLONOPIN) 0.5 MG tablet Take 1 tablet (0.5 mg total) by mouth 2 (two) times daily as needed. For anxiety 30 tablet 0  . co-enzyme Q-10 30 MG capsule Take 30 mg by mouth 3 (three) times daily.    . fish oil-omega-3 fatty acids 1000 MG capsule Take 3 g by mouth daily.    . fluticasone (FLONASE) 50 MCG/ACT nasal spray Place 2 sprays into both nostrils daily. 16 g 1  . Levonorgestrel-Ethinyl Estrad (ALTAVERA PO) Take 1  tablet by mouth daily.    . LUTEIN-ZEAXANTHIN PO Take by mouth.    . meloxicam (MOBIC) 7.5 MG tablet 1-2 tab po q day 14 tablet 0  . Multiple Vitamin (MULTIVITAMIN)  capsule Take 3 capsules by mouth daily. greensourse    . nitrofurantoin, macrocrystal-monohydrate, (MACROBID) 100 MG capsule 1 tab po q day after sex. 30 capsule 1  . SUMAtriptan (IMITREX) 100 MG tablet Take 1 tablet (100 mg total) by mouth every 2 (two) hours as needed for migraine. May repeat in 2 hours if headache persists or recurs. 12 tablet 0  . UNABLE TO FIND Med Name: Corvalen 2tsp daily     No current facility-administered medications on file prior to visit.     BP 136/86   Pulse (!) 110   Temp 98.4 F (36.9 C) (Oral)   Resp 16   Ht 5\' 8"  (1.727 m)   Wt 116 lb 3.2 oz (52.7 kg)   SpO2 100%   BMI 17.67 kg/m       Objective:   Physical Exam  General  Mental Status - Alert. General Appearance - Well groomed. Not in acute distress.  Skin Rashes- No Rashes.  HEENT Head- Normal. Ear Auditory Canal - Left- Normal. Right - Normal.Tympanic Membrane- Left- Normal. Right- Normal. Eye Sclera/Conjunctiva- Left- Normal. Right- Normal. Nose & Sinuses Nasal Mucosa- Left-  Boggy and Congested. Right-  Boggy and  Congested.Bilateral maxillary and frontal sinus pressure. Mouth & Throat Lips: Upper Lip- Normal: no dryness, cracking, pallor, cyanosis, or vesicular eruption. Lower Lip-Normal: no dryness, cracking, pallor, cyanosis or vesicular eruption. Buccal Mucosa- Bilateral- No Aphthous ulcers. Oropharynx- No Discharge or Erythema. Tonsils: Characteristics- Bilateral- No Erythema or Congestion. Size/Enlargement- Bilateral- No enlargement. Discharge- bilateral-None.  Neck Neck- Supple. No Masses.   Chest and Lung Exam Auscultation: Breath Sounds:-Clear even and unlabored.  Cardiovascular Auscultation:Rythm- Regular, rate and rhythm. Murmurs & Other Heart Sounds:Ausculatation of the heart reveal- No  Murmurs.  Lymphatic Head & Neck General Head & Neck Lymphatics: Bilateral: Description- No Localized lymphadenopathy.       Assessment & Plan:  You appear to have bronchitis. Rest hydrate and tylenol for fever. I am prescribing cough medicine benzonatate, and azithromycin antibiotic.  You should gradually get better. If not then notify us and would recommend a chest xray.  Will send in scopolamine patch to your pharmacy.   Let me know if you want referral to neurologist regarding family neurologic disease.  Follow up in 7-10 days or as needed  [employer] [employer]

## 2018-02-01 NOTE — Patient Instructions (Signed)
You appear to have bronchitis. Rest hydrate and tylenol for fever. I am prescribing cough medicine benzonatate, and azithromycin antibiotic.  You should gradually get better. If not then notify us and would recommend a chest xray.  Will send in scopolamine patch to your pharmacy.   Let me know if you want referral to neurologist regarding family neurologic disease.  Follow up in 7-10 days or as needed

## 2018-03-26 DIAGNOSIS — Z01419 Encounter for gynecological examination (general) (routine) without abnormal findings: Secondary | ICD-10-CM | POA: Diagnosis not present

## 2018-03-26 DIAGNOSIS — I10 Essential (primary) hypertension: Secondary | ICD-10-CM | POA: Diagnosis not present

## 2018-04-27 DIAGNOSIS — I10 Essential (primary) hypertension: Secondary | ICD-10-CM | POA: Diagnosis not present

## 2018-05-24 ENCOUNTER — Encounter: Payer: Self-pay | Admitting: Medical

## 2018-05-24 ENCOUNTER — Ambulatory Visit (INDEPENDENT_AMBULATORY_CARE_PROVIDER_SITE_OTHER): Payer: BLUE CROSS/BLUE SHIELD | Admitting: Medical

## 2018-05-24 ENCOUNTER — Other Ambulatory Visit: Payer: Self-pay

## 2018-05-24 VITALS — BP 137/90 | HR 98

## 2018-05-24 DIAGNOSIS — I10 Essential (primary) hypertension: Secondary | ICD-10-CM | POA: Diagnosis not present

## 2018-05-24 DIAGNOSIS — T7840XA Allergy, unspecified, initial encounter: Secondary | ICD-10-CM

## 2018-05-24 MED ORDER — PREDNISONE 10 MG (21) PO TBPK: ORAL_TABLET | ORAL | 0 refills | Status: DC

## 2018-05-24 MED ORDER — METHYLPREDNISOLONE ACETATE 40 MG/ML IJ SUSP
40.0000 mg | Freq: Once | INTRAMUSCULAR | Status: AC
  Administered 2018-05-24: 40 mg via INTRAMUSCULAR

## 2018-05-24 MED ORDER — HYDROXYZINE HCL 25 MG PO TABS: 25.0000 mg | ORAL_TABLET | Freq: Three times a day (TID) | ORAL | 0 refills | Status: DC | PRN

## 2018-05-24 NOTE — Addendum Note (Signed)
Addended by: Orlene Och on: 05/24/2018 09:59 AM   Modules accepted: Orders

## 2018-05-24 NOTE — Progress Notes (Signed)
   Subjective:    Patient ID: Morgan Day, female    DOB: 22-Apr-1985, 33 y.o.   MRN: [id number]  HPI  Virtual Visit via Video Note  I connected with Morgan Mediate Collazos on 05/24/18 at  8:40 AM EDT by a video enabled telemedicine application and verified that I am speaking with the correct person using two identifiers.  Location: Patient: home Provider: office   I discussed the limitations of evaluation and management by telemedicine and the availability of in person appointments. The patient expressed understanding and agreed to proceed.  History of Present Illness:  Pt was cleaning in her yard about one week ago.  Exposure to poison ivy or oak. She was pulling a lot of leaves. She has severe itching and can't sleep due to itch.  Pt has use calamine lotion, apple cider vinegar bath, tea tree and hydrocortisone cream.   Observations/Objective: General no acute distress.  Skin- scattered rash faint mild rt side below eye. Also moderate rash both forearms and rt side abdomen.   Assessment and Plan: You recently have allergic reaction to poison ivy. We gave you depo-medrol 40 mg  im injection. I am also prescribing oral prednisone and hydroxyzine for itching. Your rash should gradually improve. If worsening or expanding please notify us.  Follow up in 7 days or as needed.  Scheduled to get injection at 10 am today.  Follow Up Instructions:    I discussed the assessment and treatment plan with the patient. The patient was provided an opportunity to ask questions and all were answered. The patient agreed with the plan and demonstrated an understanding of the instructions.   The patient was advised to call back or seek an in-person evaluation if the symptoms worsen or if the condition fails to improve as anticipated.     Esperanza Richters, PA-C   Review of Systems  Constitutional: Negative for chills, fatigue and fever.  Respiratory: Negative for cough, chest  tightness, shortness of breath and wheezing.   Cardiovascular: Negative for chest pain and palpitations.  Gastrointestinal: Negative for abdominal pain.  Genitourinary: Negative for dysuria.  Skin: Positive for rash.  Neurological: Negative for dizziness, syncope and light-headedness.  Hematological: Negative for adenopathy. Does not bruise/bleed easily.  Psychiatric/Behavioral: Negative for behavioral problems and confusion.       Objective:   Physical Exam        Assessment & Plan:

## 2018-05-24 NOTE — Patient Instructions (Signed)
You recently have allergic reaction to poison ivy. We gave you depo-medrol 40 mg  im injection. I am also prescribing oral prednisone and hydroxyzine for itching. Your rash should gradually improve. If worsening or expanding please notify us.  Follow up in 7 days or as needed.  Scheduled to get injection at 10 am today.

## 2018-06-02 ENCOUNTER — Encounter: Payer: Self-pay | Admitting: Neurology

## 2018-06-02 ENCOUNTER — Encounter: Payer: Self-pay | Admitting: Medical

## 2018-06-02 ENCOUNTER — Telehealth: Payer: Self-pay | Admitting: Medical

## 2018-06-02 DIAGNOSIS — Z820 Family history of epilepsy and other diseases of the nervous system: Secondary | ICD-10-CM

## 2018-06-02 NOTE — Telephone Encounter (Signed)
Referral to neurologist placed. 

## 2018-06-10 ENCOUNTER — Encounter: Payer: Self-pay | Admitting: Medical

## 2018-06-24 NOTE — Progress Notes (Signed)
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  The records that were made available to me were reviewed.  Pt states that her mother was dx at age 33 - "I know that I have it."  States that she presented with gait trouble and ended up with speech and diplopia.  Asks about ampyra for the tx of this.  Balance change:  Yes.   - first time she noted it was age 33 y/o.  Balance is worse at night.  "I fall onto the toilet at night." Falls:  Has had "trips" Diplopia: Yes.   - started in her 20's and got glasses for this in her later 20s.  This was "my first symptom." Paresthesias hands/feet:  "they are cold and purple and have spasms" Vivid dreams/acting out of the dreams:  No. RLS:  Yes.  , this used to be "really bad" but "I take hemp oil and it is better."  "it helped with my migraine and knee pain as well." Depression:  No. - 'I am anxious" Syncope/near syncope:  Yes.   - "I had 2 or 3 in the entire life" but none in the last 3-4 years Cramping:  Yes.    - "charlie horses" Fatigue:  Yes.   Cognitive impairment:  Yes.    Patient did have an MRI of the brain in February, 2016.  I did have the opportunity to personally review the images.  MRI of the brain was normal, although images contained movement artifact.  Patient's sister and brother have also been diagnosed with Sca-3.  Another sister is pending testing.  Allergies  Allergen Reactions  . Other     Narcotic pain medicine she reports makes her nauseated   Current Outpatient Medications on File Prior to Visit  Medication Sig Dispense Refill  . albuterol (PROVENTIL HFA;VENTOLIN HFA) 108 (90 Base) MCG/ACT inhaler Inhale 2 puffs into the lungs every 6 (six) hours as needed for wheezing or shortness of breath. 1 Inhaler 0  . cholecalciferol (VITAMIN D) 1000 UNITS tablet Take 1,000 Units by mouth daily.    . fish oil-omega-3 fatty acids 1000 MG capsule Take 3 g by mouth daily.    .  hydrochlorothiazide (MICROZIDE) 12.5 MG capsule hydrochlorothiazide 12.5 mg capsule    . nitrofurantoin, macrocrystal-monohydrate, (MACROBID) 100 MG capsule 1 tab po q day after sex. 30 capsule 1  . SUMAtriptan (IMITREX) 100 MG tablet Take 1 tablet (100 mg total) by mouth every 2 (two) hours as needed for migraine. May repeat in 2 hours if headache persists or recurs. 12 tablet 0  . UNABLE TO FIND Med Name: Corvalen 2tsp daily     No current facility-administered medications on file prior to visit.      Past Medical History:  Diagnosis Date  . Anxiety    DR CYNTHIA WHITE FOR MED  . Anxiety   . Asthma    exercise asthma  . Asthma   . Chronic kidney disease AGE 32   REFLUX  . Chronic kidney disease   . Depression AS  TEEN  . Depression   . Frequent UTI   . Infection    FREQUENT UTI A CHILD  . PONV (postoperative nausea and vomiting)   . Urinary reflux    as child    Past Surgical History:  Procedure Laterality Date  . HERNIA REPAIR    . MOUTH SURGERY    . NECK SURGERY    . THYROGLOSSAL  DUCT CYST  05/09/2011  . THYROGLOSSAL DUCT CYST  05/09/2011   Procedure: THYROGLOSSAL DUCT CYST;  Surgeon: Osborn Coho, MD;  Location: [facility] OR;  Service: ENT;  Laterality: N/A;  . URINARY SURGERY     for reflux   as child    Social History   Tobacco Use  . Smoking status: Former Smoker    Types: Cigarettes  . Smokeless tobacco: Never Used  Substance Use Topics  . Alcohol use: Not Currently    Alcohol/week: 0.0 standard drinks    Comment: social  . Drug use: Not Currently    Past Surgical History:  Procedure Laterality Date  . HERNIA REPAIR    . MOUTH SURGERY    . NECK SURGERY    . THYROGLOSSAL DUCT CYST  05/09/2011  . THYROGLOSSAL DUCT CYST  05/09/2011   Procedure: THYROGLOSSAL DUCT CYST;  Surgeon: Osborn Coho, MD;  Location: [facility] OR;  Service: ENT;  Laterality: N/A;  . URINARY SURGERY     for reflux   as child     Family History  Problem Relation Age of Onset  . Cancer  Mother        BREAST CANCER  . Ataxia Mother        SCA  . Depression Mother   . Anxiety disorder Mother   . Other Mother        NAUSEA WITH  NARCOTICS  . Hypertension Father   . Other Sister   . Cancer Sister        SKIN  . Other Brother        SCA  . Other Sister        SCA  . Other Maternal Grandmother        SCA  . Dementia Paternal Grandmother   . Colon cancer Paternal Grandmother     Review of Systems  Constitutional: Positive for weight loss ("I eat and exercise" but weight has reduced).  HENT: Negative.   Eyes: Negative.   Respiratory: Negative.   Cardiovascular: Positive for chest pain (if "I move or stretch").  Gastrointestinal: Negative.   Genitourinary: Positive for urgency (frequent UTI).  Musculoskeletal: Negative.   Skin: Negative.   Endo/Heme/Allergies: Negative.   Psychiatric/Behavioral: The patient is nervous/anxious.      Gen:  Appears stated age and in NAD. HEENT:  Normocephalic, atraumatic. The mucous membranes are moist. The superficial temporal arteries are without ropiness or tenderness. Cardiovascular: Regular rate and rhythm. Lungs: Clear to auscultation bilaterally. Neck: There are no carotid bruits noted bilaterally.  NEUROLOGICAL:  Orientation:  The patient is alert and oriented x 3.  Recent and remote memory are intact.  Attention span and concentration are normal.  Able to name objects and repeat without trouble.  Fund of knowledge is appropriate Cranial nerves: There is good facial symmetry.  visual fields are full to confrontational testing.  No nystagmus is noted.  No square wave jerks.  She has some overshoot dysmetria.  Speech is fluent and clear. Soft palate rises symmetrically and there is no tongue deviation. Hearing is intact to conversational tone. Tone: Tone is good throughout. Sensation: Sensation is intact to light touch and pinprick throughout (facial, trunk, extremities). Vibration is intact at the bilateral big toe. There  is no extinction with double simultaneous stimulation. There is no sensory dermatomal level identified. Coordination:  The patient has no difficulty with RAM's or FNF bilaterally. Motor: Strength is 5/5 in the bilateral upper and lower extremities.  Shoulder shrug is equal bilaterally.  There is no pronator drift.  There are no fasciculations noted. DTR's: Deep tendon reflexes are 2+/4 at the bilateral biceps, triceps, brachioradialis, 3+/4 at the bilateral patella and achilles.  There are cross adductor reflexes at the bilateral patella.  Plantar responses are downgoing bilaterally. Gait and Station: The patient is able to ambulate without difficulty. The patient is able to heel toe walk without any difficulty. The patient has mild trouble ambulating in a tandem fashion. The patient is able to stand in the Romberg position.   No results found for: TSH    Chemistry      Component Value Date/Time   NA 137 08/19/2014 0840   K 3.6 08/19/2014 0840   CL 102 08/19/2014 0840   CO2 27 08/19/2014 0840   BUN 7 08/19/2014 0840   CREATININE 0.64 08/19/2014 0840      Component Value Date/Time   CALCIUM 9.1 08/19/2014 0840   ALKPHOS 59 08/19/2014 0840   AST 19 08/19/2014 0840   ALT 16 08/19/2014 0840   BILITOT 1.0 08/19/2014 0840       Assessment/Plan:  1. Ataxia and diplopia by history (unable to elicit today) with family history of spinocerebellar ataxia-3, an aut dominant condition. a. -states that she gets labs, including tsh for the weight loss, via Dr. Shawnie Pons (ob-gyn).  States that lab work has been normal.  Does not wish to have further lab work as states that it is followed elsewhere. b. Discussed benefits and downfalls of having genetic testing.  Discussed specifically the downfalls of inability to get future life/disability insurance.  Patient admits that really she had not thought about this previously.  Asked her if she would like to hold off on the testing and discuss with her husband.   Could get genetic counselor involved as well pre testing but she declined and stated that she really would like to get the testing and has thought about the testing.   c. As above, the patient asks about ampyra and asks several times if I would prescribe this to her.  She learned about this on a Facebook forum for pts with SCA-3.  Discussed that this is certainly not on label.  Discussed that while there really is not a cure or specific tx for SCA-3, it is usually treated symptomatically and I would recommend treating it as such with medications that are recognized as standard of care for the treatment, even if off label.  At this point in time, I would not start this first. d. F/u will be depending on results of above  CC:  Saguier, Ramon Dredge, [employer]

## 2018-06-28 ENCOUNTER — Ambulatory Visit (INDEPENDENT_AMBULATORY_CARE_PROVIDER_SITE_OTHER): Payer: BC Managed Care – PPO | Admitting: Neurology

## 2018-06-28 ENCOUNTER — Other Ambulatory Visit: Payer: Self-pay

## 2018-06-28 ENCOUNTER — Encounter: Payer: Self-pay | Admitting: Neurology

## 2018-06-28 VITALS — BP 133/93 | HR 81 | Temp 97.7°F | Ht 68.0 in | Wt 115.4 lb

## 2018-06-28 DIAGNOSIS — R270 Ataxia, unspecified: Secondary | ICD-10-CM | POA: Diagnosis not present

## 2018-06-28 DIAGNOSIS — R202 Paresthesia of skin: Secondary | ICD-10-CM

## 2018-06-28 DIAGNOSIS — H532 Diplopia: Secondary | ICD-10-CM | POA: Diagnosis not present

## 2018-07-08 DIAGNOSIS — R270 Ataxia, unspecified: Secondary | ICD-10-CM | POA: Diagnosis not present

## 2018-07-08 DIAGNOSIS — H532 Diplopia: Secondary | ICD-10-CM | POA: Diagnosis not present

## 2018-08-10 ENCOUNTER — Telehealth: Payer: Self-pay | Admitting: Neurology

## 2018-08-10 NOTE — Telephone Encounter (Signed)
[facility] testing for SCA3 with 77 repeats (>60 positive).  Please put on schedule 8/4 at 2pm or she can schedule with front staff for virtual visit on 8/5 or 8/7 to go over results.

## 2018-08-10 NOTE — Telephone Encounter (Signed)
Called spoke with patient appt for VV scheduled for 08/27/18 @ 9:15

## 2018-08-26 ENCOUNTER — Other Ambulatory Visit: Payer: Self-pay

## 2018-08-26 NOTE — Progress Notes (Signed)
Virtual Visit via Video Note The purpose of this virtual visit is to provide medical care while limiting exposure to the novel coronavirus.    Consent was obtained for video visit:  Yes.   Answered questions that patient had about telehealth interaction:  Yes.   I discussed the limitations, risks, security and privacy concerns of performing an evaluation and management service by telemedicine. I also discussed with the patient that there may be a patient responsible charge related to this service. The patient expressed understanding and agreed to proceed.  Pt location: Home Physician Location: office Name of referring provider:  Saguier, Ramon DredgeEdward, PA-C I connected with Morgan MediateJennifer McAdams Spragg at patients initiation/request on 08/27/2018 at  9:15 AM EDT by video enabled telemedicine application and verified that I am speaking with the correct person using two identifiers. Pt MRN:  [id number] Pt DOB:  10/19/1985 Video Participants:  Morgan Day;     History of Present Illness:  Patient seen today in follow-up for test results.  She had genetic testing since her last visit for spinocerebellar ataxia.  Her testing was positive, with 77 repeats (greater than 60 positive).  She continues to have issues with cramping of the legs and being off balance.  States that she has significant cramping, feeling that is one of the biggest issues.  She has intermittent diplopia.   Current Outpatient Medications on File Prior to Visit  Medication Sig Dispense Refill  . albuterol (PROVENTIL HFA;VENTOLIN HFA) 108 (90 Base) MCG/ACT inhaler Inhale 2 puffs into the lungs every 6 (six) hours as needed for wheezing or shortness of breath. 1 Inhaler 0  . cholecalciferol (VITAMIN D) 1000 UNITS tablet Take 1,000 Units by mouth daily.    . fish oil-omega-3 fatty acids 1000 MG capsule Take 3 g by mouth daily.    . hydrochlorothiazide (MICROZIDE) 12.5 MG capsule hydrochlorothiazide 12.5 mg capsule    .  nitrofurantoin, macrocrystal-monohydrate, (MACROBID) 100 MG capsule 1 tab po q day after sex. 30 capsule 1  . SRONYX 0.1-20 MG-MCG tablet Take 1 tablet by mouth daily.    . SUMAtriptan (IMITREX) 100 MG tablet Take 1 tablet (100 mg total) by mouth every 2 (two) hours as needed for migraine. May repeat in 2 hours if headache persists or recurs. 12 tablet 0  . UNABLE TO FIND Med Name: Corvalen 2tsp daily    . UNABLE TO FIND CBD OIL    . vitamin C (ASCORBIC ACID) 500 MG tablet Take 500 mg by mouth daily.     No current facility-administered medications on file prior to visit.      Observations/Objective:   Vitals:   08/27/18 0900  Weight: 115 lb (52.2 kg)  Height: 5\' 8"  (1.727 m)    Body mass index is 17.49 kg/m.   GEN:  The patient appears stated age and is in NAD.  Neurological examination:  Orientation: The patient is alert and oriented x3. Cranial nerves: There is good facial symmetry. There is no facial hypomimia.  The speech is fluent and clear. Soft palate rises symmetrically and there is no tongue deviation. Hearing is intact to conversational tone. Motor: Strength is at least antigravity x 4.   Shoulder shrug is equal and symmetric.  There is no pronator drift.  Movement examination: Tone: unable Abnormal movements: none Coordination:  Not tested today Gait and Station: The patient is ambulating throughout her house when we first got on the video and did well with that    Assessment and Plan:  1.  Spinocerebellar ataxia type 3  -Discussed implications for her children.  Discussed concept of anticipation.  Is going to discuss with childrens pediatrician  -Discussed that treatment is purely symptomatic.  Discussed benefits of physical therapy for balance, given her falls.  Discussed the importance of exercises that build her core strength.  -Discussed medication treatments for cramping.  Discussed baclofen and tizanidine.  Ultimately, we decided to start with baclofen, 5  mg twice per day.  I told her this may not be enough.  She is just going to start with once a day for a week and then, if tolerated, will go to twice per day.  She will call me in a [place] and let me know if she is tolerating this and if it is helping her symptoms.  Prescription was sent to the pharmacy.  -Patient is interested in 4-aminopyridine.  Discussed this vs ampyra.  Discussed that 4-aminopyridine is a compounded drug and has to be given frequently, and is at higher risk for overdose/seizure.  Discussed that Ampyra may not be approved given off label diagnosis.  I really would like her to try other therapies before we try this and she agreed with that.  VPA has also been studied some as relates to balance.  However, discussed with the patient that I really would not want to use this given that she is of childbearing age (although it could help with her weight loss).  2.   Weight loss  -May be associated with her diagnosis.  She has been very frustrated with the weight loss, and has been unsuccessful with attempts at gaining more weight, despite eating more.  We will refer her to nutrition.  Follow Up Instructions:  yearly, unless new issues arise   -I discussed the assessment and treatment plan with the patient. The patient was provided an opportunity to ask questions and all were answered. The patient agreed with the plan and demonstrated an understanding of the instructions.   The patient was advised to call back or seek an in-person evaluation if the symptoms worsen or if the condition fails to improve as anticipated.    Total Time spent in visit with the patient was:  25 min, of which more than 50% of the time was spent in counseling.   Pt understands and agrees with the plan of care outlined.     Alonza Bogus, DO

## 2018-08-27 ENCOUNTER — Other Ambulatory Visit: Payer: Self-pay

## 2018-08-27 ENCOUNTER — Telehealth (INDEPENDENT_AMBULATORY_CARE_PROVIDER_SITE_OTHER): Payer: BC Managed Care – PPO | Admitting: Neurology

## 2018-08-27 ENCOUNTER — Encounter: Payer: Self-pay | Admitting: Neurology

## 2018-08-27 VITALS — Ht 68.0 in | Wt 115.0 lb

## 2018-08-27 DIAGNOSIS — R634 Abnormal weight loss: Secondary | ICD-10-CM

## 2018-08-27 DIAGNOSIS — G118 Other hereditary ataxias: Secondary | ICD-10-CM

## 2018-08-27 HISTORY — DX: Other hereditary ataxias: G11.8

## 2018-08-27 HISTORY — DX: Abnormal weight loss: R63.4

## 2018-08-27 MED ORDER — BACLOFEN 10 MG PO TABS: 5.0000 mg | ORAL_TABLET | Freq: Two times a day (BID) | ORAL | 3 refills | Status: DC

## 2018-08-27 NOTE — Addendum Note (Signed)
Addended by: Ranae Plumber on: 08/27/2018 10:06 AM   Modules accepted: Orders

## 2018-09-06 ENCOUNTER — Other Ambulatory Visit: Payer: Self-pay | Admitting: Medical

## 2018-09-06 MED ORDER — NITROFURANTOIN MONOHYD MACRO 100 MG PO CAPS: ORAL_CAPSULE | ORAL | 1 refills | Status: DC

## 2018-09-06 NOTE — Telephone Encounter (Signed)
Please advise 

## 2018-09-06 NOTE — Telephone Encounter (Signed)
Medication Refill - Medication: nitrofurantoin, macrocrystal-monohydrate, (MACROBID) 100 MG capsule  Has the patient contacted their pharmacy? Yes - new pharmacy to her so she needs new script sent in (Agent: If no, request that the patient contact the pharmacy for the refill.) (Agent: If yes, when and what did the pharmacy advise?)  Preferred Pharmacy (with phone number or street name):  [id number] - [place], [place] [phone] (Phone) [phone] (Fax)   Agent: Please be advised that RX refills may take up to 3 business days. We ask that you follow-up with your pharmacy.

## 2018-10-06 MED ORDER — UNABLE TO FIND: 4 refills | Status: DC

## 2018-10-06 NOTE — Telephone Encounter (Signed)
Phone in Rx to pharmacy spoke with Gwinda Passe They will call patient once filled to pick up

## 2018-11-10 ENCOUNTER — Other Ambulatory Visit: Payer: Self-pay

## 2018-11-10 MED ORDER — BACLOFEN 10 MG PO TABS: 5.0000 mg | ORAL_TABLET | Freq: Two times a day (BID) | ORAL | 3 refills | Status: DC

## 2018-11-12 MED ORDER — BACLOFEN 10 MG PO TABS: 10.0000 mg | ORAL_TABLET | Freq: Two times a day (BID) | ORAL | 1 refills | Status: DC

## 2018-11-28 ENCOUNTER — Encounter: Payer: Self-pay | Admitting: Medical

## 2018-12-06 ENCOUNTER — Encounter: Payer: Self-pay | Admitting: Medical

## 2018-12-06 ENCOUNTER — Other Ambulatory Visit: Payer: Self-pay

## 2018-12-06 ENCOUNTER — Telehealth: Payer: Self-pay | Admitting: Medical

## 2018-12-06 ENCOUNTER — Ambulatory Visit (INDEPENDENT_AMBULATORY_CARE_PROVIDER_SITE_OTHER): Payer: BC Managed Care – PPO | Admitting: Medical

## 2018-12-06 VITALS — Temp 99.0°F | Wt 113.4 lb

## 2018-12-06 DIAGNOSIS — F419 Anxiety disorder, unspecified: Secondary | ICD-10-CM

## 2018-12-06 MED ORDER — ALPRAZOLAM 0.5 MG PO TABS: ORAL_TABLET | ORAL | 0 refills | Status: DC

## 2018-12-06 NOTE — Patient Instructions (Signed)
For anxiety, will give you 15 tab xanax rx. You have use sparingly in past. If having to use too frequently then let me know and would rx ssri type medication to help with daily anxiety. Will refill you xanax when needed.  Follow up as needed

## 2018-12-06 NOTE — Progress Notes (Signed)
Subjective:    Patient ID: Morgan Day, female    DOB: 1985/02/10, 33 y.o.   MRN: [id number]  HPI  Virtual Visit via Telephone Note  I connected with Morgan Mediate Lindon on 12/06/18 at  3:20 PM EST by telephone and verified that I am speaking with the correct person using two identifiers.  Location: Patient: home Provider: [facility]   I discussed the limitations, risks, security and privacy concerns of performing an evaluation and management service by telephone and the availability of in person appointments. I also discussed with the patient that there may be a patient responsible charge related to this service. The patient expressed understanding and agreed to proceed.   History of Present Illness: Pt in for follow up.  She states recent flare up of prior to anxiety. She states she has been handling pandemic ok. But has gradually increased. She states pandemic, election and home schooling getting the best of her. Pt in the past has used xanax very sparingly and she needs some to use on occasional for severe anxiety/panic attacks.  She states just needs medication for rescue. She only used 10 tabs in past 3 years.     Observations/Objective:  General- no acute, no distress, pleasant, oriented and normal speech.    Assessment and Plan: For anxiety, will give you 15 tab xanax rx. You have use sparingly in past. If having to use too frequently then let me know and would rx ssri type medication to help with daily anxiety. Will refill you xanax when needed.  Follow up as needed  Esperanza Richters, PA-C  Follow Up Instructions:    I discussed the assessment and treatment plan with the patient. The patient was provided an opportunity to ask questions and all were answered. The patient agreed with the plan and demonstrated an understanding of the instructions.   The patient was advised to call back or seek an in-person evaluation if the symptoms worsen or if the  condition fails to improve as anticipated.  I provided 15  minutes of non-face-to-face time during this encounter.   Esperanza Richters, PA-C    Review of Systems  Constitutional: Negative for chills, fatigue and fever.  Respiratory: Negative for cough, chest tightness, shortness of breath and wheezing.   Cardiovascular: Negative for chest pain and palpitations.  Gastrointestinal: Negative for abdominal pain.  Musculoskeletal: Negative for back pain.  Hematological: Negative for adenopathy. Does not bruise/bleed easily.  Psychiatric/Behavioral: Negative for behavioral problems, decreased concentration, hallucinations, sleep disturbance and suicidal ideas. The patient is nervous/anxious.     Past Medical History:  Diagnosis Date  . Anxiety    DR CYNTHIA WHITE FOR MED  . Anxiety   . Asthma    exercise asthma  . Asthma   . Chronic kidney disease AGE 32   REFLUX  . Chronic kidney disease   . Depression AS  TEEN  . Depression   . Frequent UTI   . Infection    FREQUENT UTI A CHILD  . PONV (postoperative nausea and vomiting)   . Urinary reflux    as child     Social History   Socioeconomic History  . Marital status: Married    Spouse name: MARIO  . Number of children: 1  . Years of education: 90  . Highest education level: Not on file  Occupational History  . Occupation: [employer]  Social Needs  . Financial resource strain: Not on file  . Food insecurity    Worry: Not on  file    Inability: Not on file  . Transportation needs    Medical: Not on file    Non-medical: Not on file  Tobacco Use  . Smoking status: Former Smoker    Types: Cigarettes  . Smokeless tobacco: Never Used  Substance and Sexual Activity  . Alcohol use: Not Currently    Alcohol/week: 0.0 standard drinks    Comment: social  . Drug use: Not Currently  . Sexual activity: Never    Partners: Male    Birth control/protection: Pill    Comment: D/C'D 06/2011  Lifestyle  . Physical activity    Days  per week: Not on file    Minutes per session: Not on file  . Stress: Not on file  Relationships  . Social Herbalist on phone: Not on file    Gets together: Not on file    Attends religious service: Not on file    Active member of club or organization: Not on file    Attends meetings of clubs or organizations: Not on file    Relationship status: Not on file  . Intimate partner violence    Fear of current or ex partner: Not on file    Emotionally abused: Not on file    Physically abused: Not on file    Forced sexual activity: Not on file  Other Topics Concern  . Not on file  Social History Narrative   Pt lives with at home with Spouse and 2 children, and with father in law   Right handed   Drinks coffee, and tea occasionally, and soda         Past Surgical History:  Procedure Laterality Date  . HERNIA REPAIR    . MOUTH SURGERY    . NECK SURGERY    . THYROGLOSSAL DUCT CYST  05/09/2011  . THYROGLOSSAL DUCT CYST  05/09/2011   Procedure: THYROGLOSSAL DUCT CYST;  Surgeon: Jerrell Belfast, MD;  Location: [facility];  Service: ENT;  Laterality: N/A;  . URINARY SURGERY     for reflux   as child    Family History  Problem Relation Age of Onset  . Cancer Mother        BREAST CANCER  . Ataxia Mother        SCA-3  . Depression Mother   . Anxiety disorder Mother   . Other Mother        NAUSEA WITH  NARCOTICS  . Hypertension Father   . Other Sister   . Cancer Sister        SKIN  . Other Brother        SCA-3  . Other Sister        SCA-3  . Other Maternal Grandmother        SCA  . Dementia Paternal Grandmother   . Colon cancer Paternal Grandmother     Allergies  Allergen Reactions  . Other     Narcotic pain medicine she reports makes her nauseated    Current Outpatient Medications on File Prior to Visit  Medication Sig Dispense Refill  . albuterol (PROVENTIL HFA;VENTOLIN HFA) 108 (90 Base) MCG/ACT inhaler Inhale 2 puffs into the lungs every 6 (six) hours as  needed for wheezing or shortness of breath. 1 Inhaler 0  . baclofen (LIORESAL) 10 MG tablet Take 1 tablet (10 mg total) by mouth 2 (two) times daily. 180 tablet 1  . cholecalciferol (VITAMIN D) 1000 UNITS tablet Take 1,000 Units by mouth daily.    Marland Kitchen  DALFAMPRIDINE ER PO Take by mouth.    . fish oil-omega-3 fatty acids 1000 MG capsule Take 3 g by mouth daily.    . hydrochlorothiazide (MICROZIDE) 12.5 MG capsule hydrochlorothiazide 12.5 mg capsule    . nitrofurantoin, macrocrystal-monohydrate, (MACROBID) 100 MG capsule 1 tab po q day after sex. 30 capsule 1  . SRONYX 0.1-20 MG-MCG tablet Take 1 tablet by mouth daily.    . SUMAtriptan (IMITREX) 100 MG tablet Take 1 tablet (100 mg total) by mouth every 2 (two) hours as needed for migraine. May repeat in 2 hours if headache persists or recurs. 12 tablet 0  . UNABLE TO FIND Med Name: Corvalen 2tsp daily    . UNABLE TO FIND CBD OIL    . UNABLE TO FIND 4-aminopyradine, 5 mg q day for a week and then 5 mg q 12 hours (write this instead of bid) thereafter. 60 each 4  . vitamin C (ASCORBIC ACID) 500 MG tablet Take 500 mg by mouth daily.     No current facility-administered medications on file prior to visit.     Temp 99 F (37.2 C) (Oral)   Wt 113 lb 6.4 oz (51.4 kg)   BMI 17.24 kg/m       Objective:   Physical Exam        Assessment & Plan:

## 2019-03-09 ENCOUNTER — Other Ambulatory Visit: Payer: Self-pay | Admitting: Neurology

## 2019-03-09 ENCOUNTER — Encounter: Payer: Self-pay | Admitting: Medical

## 2019-03-11 ENCOUNTER — Telehealth: Payer: Self-pay

## 2019-03-11 NOTE — Telephone Encounter (Addendum)
Spoke with pharmacist at [facility] and she stated patient is due for a refill.  Spoke with Dr Tat and she states for patient to take 1 tablet every 12 hours. #180 with 1 refill.   Pharmacy notified and voiced understanding.    Rx(s) sent to pharmacy electronically.

## 2019-03-17 MED ORDER — UNABLE TO FIND: 1 refills | Status: DC

## 2019-04-01 ENCOUNTER — Other Ambulatory Visit: Payer: Self-pay

## 2019-04-04 ENCOUNTER — Other Ambulatory Visit: Payer: Self-pay

## 2019-04-04 ENCOUNTER — Ambulatory Visit: Payer: BC Managed Care – PPO | Admitting: Medical

## 2019-04-04 VITALS — BP 130/72 | HR 88 | Resp 18 | Ht 68.0 in | Wt 121.6 lb

## 2019-04-04 DIAGNOSIS — R300 Dysuria: Secondary | ICD-10-CM

## 2019-04-04 DIAGNOSIS — N390 Urinary tract infection, site not specified: Secondary | ICD-10-CM

## 2019-04-04 LAB — POC URINALSYSI DIPSTICK (AUTOMATED)
Bilirubin, UA: NEGATIVE
Blood, UA: NEGATIVE
Glucose, UA: NEGATIVE
Ketones, UA: NEGATIVE
Leukocytes, UA: NEGATIVE
Nitrite, UA: NEGATIVE
Protein, UA: NEGATIVE
Spec Grav, UA: 1.015 (ref 1.010–1.025)
Urobilinogen, UA: 0.2 E.U./dL
pH, UA: 7 (ref 5.0–8.0)

## 2019-04-04 MED ORDER — CIPROFLOXACIN HCL 250 MG PO TABS: 250.0000 mg | ORAL_TABLET | Freq: Two times a day (BID) | ORAL | 0 refills | Status: DC

## 2019-04-04 MED ORDER — NITROFURANTOIN MONOHYD MACRO 100 MG PO CAPS: ORAL_CAPSULE | ORAL | 1 refills | Status: DC

## 2019-04-04 NOTE — Addendum Note (Signed)
Addended by: Mervin Kung A on: 04/04/2019 11:41 AM   Modules accepted: Orders

## 2019-04-04 NOTE — Progress Notes (Addendum)
Subjective:    Patient ID: Morgan Day, female    DOB: 16-Jun-1985, 34 y.o.   MRN: [id number]  HPI  Pt in for possible uti. She has history of uti in the past.  Pt states she had uti symptoms for about 2 weeks. State burning, frequent urination and odor to urine. No fever,no chills or sweats.   LMP- 3 weeks ago.Pt is on ocp.    Review of Systems  Constitutional: Negative for chills, fatigue and fever.  Respiratory: Negative for cough, chest tightness, shortness of breath and wheezing.   Cardiovascular: Negative for chest pain and palpitations.  Gastrointestinal: Negative for abdominal pain, constipation, diarrhea and nausea.  Endocrine: Negative for polydipsia and polyuria.  Genitourinary: Positive for dysuria and frequency. Negative for decreased urine volume, difficulty urinating, urgency and vaginal pain.  Musculoskeletal: Negative for back pain.  Skin: Negative for rash.  Neurological: Negative for dizziness, syncope, weakness and headaches.  Hematological: Negative for adenopathy. Does not bruise/bleed easily.  Psychiatric/Behavioral: Negative for decreased concentration and sleep disturbance. The patient is not nervous/anxious.     Past Medical History:  Diagnosis Date  . Anxiety    DR CYNTHIA WHITE FOR MED  . Anxiety   . Asthma    exercise asthma  . Asthma   . Chronic kidney disease AGE 11   REFLUX  . Chronic kidney disease   . Depression AS  TEEN  . Depression   . Frequent UTI   . Infection    FREQUENT UTI A CHILD  . PONV (postoperative nausea and vomiting)   . Urinary reflux    as child     Social History   Socioeconomic History  . Marital status: Married    Spouse name: MARIO  . Number of children: 1  . Years of education: 63  . Highest education level: Not on file  Occupational History  . Occupation: H0MEMAKER  Tobacco Use  . Smoking status: Former Smoker    Types: Cigarettes  . Smokeless tobacco: Never Used  Substance and Sexual  Activity  . Alcohol use: Not Currently    Alcohol/week: 0.0 standard drinks    Comment: social  . Drug use: Not Currently  . Sexual activity: Never    Partners: Male    Birth control/protection: Pill    Comment: D/C'D 06/2011  Other Topics Concern  . Not on file  Social History Narrative   Pt lives with at home with Spouse and 2 children, and with father in law   Right handed   Drinks coffee, and tea occasionally, and soda        Social Determinants of Health   Financial Resource Strain:   . Difficulty of Paying Living Expenses:   Food Insecurity:   . Worried About Programme researcher, broadcasting/film/video in the Last Year:   . Barista in the Last Year:   Transportation Needs:   . Freight forwarder (Medical):   Marland Kitchen Lack of Transportation (Non-Medical):   Physical Activity:   . Days of Exercise per Week:   . Minutes of Exercise per Session:   Stress:   . Feeling of Stress :   Social Connections:   . Frequency of Communication with Friends and Family:   . Frequency of Social Gatherings with Friends and Family:   . Attends Religious Services:   . Active Member of Clubs or Organizations:   . Attends Banker Meetings:   Marland Kitchen Marital Status:   Intimate Partner Violence:   .  Fear of Current or Ex-Partner:   . Emotionally Abused:   Marland Kitchen Physically Abused:   . Sexually Abused:     Past Surgical History:  Procedure Laterality Date  . HERNIA REPAIR    . MOUTH SURGERY    . NECK SURGERY    . THYROGLOSSAL DUCT CYST  05/09/2011  . THYROGLOSSAL DUCT CYST  05/09/2011   Procedure: THYROGLOSSAL DUCT CYST;  Surgeon: Osborn Coho, MD;  Location: [facility] OR;  Service: ENT;  Laterality: N/A;  . URINARY SURGERY     for reflux   as child    Family History  Problem Relation Age of Onset  . Cancer Mother        BREAST CANCER  . Ataxia Mother        SCA-3  . Depression Mother   . Anxiety disorder Mother   . Other Mother        NAUSEA WITH  NARCOTICS  . Hypertension Father   . Other  Sister   . Cancer Sister        SKIN  . Other Brother        SCA-3  . Other Sister        SCA-3  . Other Maternal Grandmother        SCA  . Dementia Paternal Grandmother   . Colon cancer Paternal Grandmother     Allergies  Allergen Reactions  . Other     Narcotic pain medicine she reports makes her nauseated    Current Outpatient Medications on File Prior to Visit  Medication Sig Dispense Refill  . albuterol (PROVENTIL HFA;VENTOLIN HFA) 108 (90 Base) MCG/ACT inhaler Inhale 2 puffs into the lungs every 6 (six) hours as needed for wheezing or shortness of breath. 1 Inhaler 0  . ALPRAZolam (XANAX) 0.5 MG tablet 1/2-1 tab po q day as needed anxiety 15 tablet 0  . baclofen (LIORESAL) 10 MG tablet Take 1 tablet (10 mg total) by mouth 2 (two) times daily. 180 tablet 1  . cholecalciferol (VITAMIN D) 1000 UNITS tablet Take 1,000 Units by mouth daily.    Marland Kitchen DALFAMPRIDINE ER PO Take by mouth.    . fish oil-omega-3 fatty acids 1000 MG capsule Take 3 g by mouth daily.    . hydrochlorothiazide (MICROZIDE) 12.5 MG capsule hydrochlorothiazide 12.5 mg capsule    . nitrofurantoin, macrocrystal-monohydrate, (MACROBID) 100 MG capsule 1 tab po q day after sex. 30 capsule 1  . SRONYX 0.1-20 MG-MCG tablet Take 1 tablet by mouth daily.    . SUMAtriptan (IMITREX) 100 MG tablet Take 1 tablet (100 mg total) by mouth every 2 (two) hours as needed for migraine. May repeat in 2 hours if headache persists or recurs. 12 tablet 0  . UNABLE TO FIND Med Name: Corvalen 2tsp daily    . UNABLE TO FIND CBD OIL    . UNABLE TO FIND 4-aminopyradine,Take 1 tablet 5 mg q 12 hours (write this instead of bid). 180 each 1  . vitamin C (ASCORBIC ACID) 500 MG tablet Take 500 mg by mouth daily.     No current facility-administered medications on file prior to visit.    BP 130/72 (BP Location: Left Arm, Patient Position: Sitting, Cuff Size: Normal)   Pulse 88   Resp 18   Ht 5\' 8"  (1.727 m)   Wt 121 lb 9.6 oz (55.2 kg)   LMP  03/07/2019 (Approximate)   SpO2 98%   BMI 18.49 kg/m       Objective:  Physical Exam  General Mental Status- Alert. General Appearance- Not in acute distress.   Skin General: Color- Normal Color. Moisture- Normal Moisture.  Neck Carotid Arteries- Normal color. Moisture- Normal Moisture. No carotid bruits. No JVD.  Chest and Lung Exam Auscultation: Breath Sounds:-Normal.  Cardiovascular Auscultation:Rythm- Regular. Murmurs & Other Heart Sounds:Auscultation of the heart reveals- No Murmurs.  Abdomen Inspection:-Inspeection Normal. Palpation/Percussion:Note:No mass. Palpation and Percussion of the abdomen reveal- Non Tender, Non Distended + BS, no rebound or guarding.  Neurologic Cranial Nerve exam:- CN III-XII intact(No nystagmus), symmetric smile. Strength:- 5/5 equal and symmetric strength both upper and lower extremities.  Back- no cva tenderness.        Assessment & Plan:  Hx of uti and likely uti based on signs/symptoms as well as history. Will rx cipro antibiotic. Will rx macrobid for uti prevention as you used in the past.   Urine culture pending.  Follow up as needed.  Mackie Pai, PA-C

## 2019-04-04 NOTE — Patient Instructions (Addendum)
Hx of uti and likely uti based on signs/symptoms as well as history. Will rx cipro antibiotic. Will rx macrobid for uti prevention as you used in the past.   Urine culture pending.  Follow up as needed.

## 2019-04-04 NOTE — Addendum Note (Signed)
Addended by: Gwenevere Abbot on: 04/04/2019 11:21 AM   Modules accepted: Orders

## 2019-04-06 ENCOUNTER — Telehealth: Payer: Self-pay | Admitting: Medical

## 2019-04-06 LAB — URINE CULTURE
MICRO NUMBER:: 10251464
SPECIMEN QUALITY:: ADEQUATE

## 2019-04-06 MED ORDER — CIPROFLOXACIN HCL 500 MG PO TABS: 500.0000 mg | ORAL_TABLET | Freq: Two times a day (BID) | ORAL | 0 refills | Status: DC

## 2019-04-06 NOTE — Telephone Encounter (Signed)
4 day rx of cipro sent in after urine culture review.

## 2019-05-11 DIAGNOSIS — Z01411 Encounter for gynecological examination (general) (routine) with abnormal findings: Secondary | ICD-10-CM | POA: Diagnosis not present

## 2019-05-11 DIAGNOSIS — Z0000 Encounter for general adult medical examination without abnormal findings: Secondary | ICD-10-CM | POA: Diagnosis not present

## 2019-05-11 DIAGNOSIS — R634 Abnormal weight loss: Secondary | ICD-10-CM | POA: Diagnosis not present

## 2019-05-26 ENCOUNTER — Other Ambulatory Visit: Payer: Self-pay | Admitting: Medical

## 2019-05-27 MED ORDER — ALPRAZOLAM 0.5 MG PO TABS: ORAL_TABLET | ORAL | 0 refills | Status: DC
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
## 2019-06-08 ENCOUNTER — Other Ambulatory Visit: Payer: Self-pay | Admitting: Neurology

## 2019-06-09 NOTE — Telephone Encounter (Signed)
Rx(s) sent to pharmacy electronically.  

## 2019-08-29 ENCOUNTER — Ambulatory Visit: Payer: BC Managed Care – PPO | Admitting: Neurology

## 2019-09-20 ENCOUNTER — Other Ambulatory Visit: Payer: Self-pay

## 2019-09-20 MED ORDER — UNABLE TO FIND: 0 refills | Status: DC

## 2019-09-20 NOTE — Telephone Encounter (Signed)
Spoke with Tamela Oddi the pharmacist at [facility] and gave 1 additional refill of rx for patient.

## 2019-11-25 ENCOUNTER — Ambulatory Visit: Payer: BC Managed Care – PPO | Admitting: Neurology

## 2019-11-25 NOTE — Progress Notes (Signed)
Assessment/Plan:   1.  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  Understands the concept of anticipation.  -Patient is on off label 4-aminopyridine, by her request.  Understands the risk of seizure with this medication.  -Patient asked about treatment with riluzole.  These are really not things that I recommend.  Besides for being off label with riluzole, it also has issues with liver toxicity.    -refer to PT - pt told to call them for an appt.  2.  Diplopia  -needs to f/u with optometry.  In theory, she needs ophthalmology.  Discussed that vs neuro-ophthalmology.    3.  Intermittent dysphagia  -discussed whether or not to do mbe.  We will get that scheduled.  Will be helpful at least for baseline.  4.  M. Spasm  -increase baclofen to 10 mg tid Subjective:   Morgan Day was seen today in follow up for spinocerebellar ataxia type III.  My previous records as well as any outside records available were reviewed prior to todays visit.  Pt is currently on 4-aminopyridine, 5 mg every 12 hours.    Pt denies falls but a few close calls/near falls.  She has diplopia - she sees optometry (not ophthalmology).  She has prisms in the glasses.   Eyes dry at night at times.  Feels that eyes don't close all the way at night. Pt denies lightheadedness, near syncope.  No hallucinations.  Mood has been good and a "positive outlook."  Intermittent choking, even with air.  Has not had MBE.  Some intermittent speech trouble as well.  Having some muscle spasms and feels "tight."   It doesn't hurt but she just states that it feels "tight."   CURRENT MEDICATIONS:  Outpatient Encounter Medications as of 11/29/2019  Medication Sig  . albuterol (PROVENTIL HFA;VENTOLIN HFA) 108 (90 Base) MCG/ACT inhaler Inhale 2 puffs into the lungs every 6 (six) hours as needed for wheezing or shortness of breath.  . ALPRAZolam (XANAX) 0.5 MG tablet 1/2-1 tab po q day as needed  anxiety  . baclofen (LIORESAL) 10 MG tablet TAKE 1 TABLET BY MOUTH TWICE A DAY  . Calcium Carb-Cholecalciferol (CALCIUM 500 + D PO) Take 1 tablet by mouth in the morning and at bedtime.  . cholecalciferol (VITAMIN D) 1000 UNITS tablet Take 1,000 Units by mouth daily.  . hydrochlorothiazide (MICROZIDE) 12.5 MG capsule hydrochlorothiazide 12.5 mg capsule  . nitrofurantoin, macrocrystal-monohydrate, (MACROBID) 100 MG capsule 1 tab po q day after sex.  . NON FORMULARY 2 mushroom supplements twice a day  . NON FORMULARY Mega benfotiamine 250mg  take 1 tablet once a day  . SRONYX 0.1-20 MG-MCG tablet Take 1 tablet by mouth daily.  . SUMAtriptan (IMITREX) 100 MG tablet Take 1 tablet (100 mg total) by mouth every 2 (two) hours as needed for migraine. May repeat in 2 hours if headache persists or recurs.  10-16-2004 UNABLE TO FIND CBD OIL  . UNABLE TO FIND 4-aminopyradine,Take 1 tablet 5 mg q 12 hours (write this instead of bid).  . vitamin C (ASCORBIC ACID) 500 MG tablet Take 500 mg by mouth daily.  . [DISCONTINUED] ciprofloxacin (CIPRO) 250 MG tablet Take 1 tablet (250 mg total) by mouth 2 (two) times daily. (Patient not taking: Reported on 11/29/2019)  . [DISCONTINUED] ciprofloxacin (CIPRO) 500 MG tablet Take 1 tablet (500 mg total) by mouth 2 (two) times daily. (Patient not taking: Reported on 11/29/2019)  . [DISCONTINUED] DALFAMPRIDINE ER  PO Take by mouth. (Patient not taking: Reported on 11/29/2019)  . [DISCONTINUED] fish oil-omega-3 fatty acids 1000 MG capsule Take 3 g by mouth daily. (Patient not taking: Reported on 11/29/2019)  . [DISCONTINUED] UNABLE TO FIND Med Name: Corvalen 2tsp daily (Patient not taking: Reported on 11/29/2019)   No facility-administered encounter medications on file as of 11/29/2019.     Objective:   PHYSICAL EXAMINATION:    VITALS:   Vitals:   11/29/19 1405  BP: 136/89  Pulse: (!) 106  SpO2: 100%  Weight: 111 lb (50.3 kg)  Height: 5\' 8"  (1.727 m)   HEENT:  [place]/AT CV:   Tachy.  Regular Lungs:  CTAB Neck:  No bruits  Neuro:  Orientation:  The patient is alert and oriented x 3.   Cranial nerves: There is good facial symmetry.  No nystagmus is noted.  No square wave jerks.  She has some overshoot dysmetria.  Speech is fluent and clear but has intermittent pseudobulbar characteristics. Soft palate rises symmetrically and there is no tongue deviation. Hearing is intact to conversational tone. Tone: Tone is good throughout. Sensation: Sensation is intact to light touch and pinprick throughout (facial, trunk, extremities). Vibration is intact at the bilateral big toe. There is no extinction with double simultaneous stimulation. There is no sensory dermatomal level identified. Coordination:  The patient has no difficulty with RAM's or FNF bilaterally. Motor: Strength is 5/5 in the bilateral upper and lower extremities.   Gait and Station: The patient is able to ambulate without difficulty. The patient has mild trouble ambulating in a tandem fashion. The patient is able to stand in the Romberg position.     Chemistry      Component Value Date/Time   NA 137 08/19/2014 0840   K 3.6 08/19/2014 0840   CL 102 08/19/2014 0840   CO2 27 08/19/2014 0840   BUN 7 08/19/2014 0840   CREATININE 0.64 08/19/2014 0840      Component Value Date/Time   CALCIUM 9.1 08/19/2014 0840   ALKPHOS 59 08/19/2014 0840   AST 19 08/19/2014 0840   ALT 16 08/19/2014 0840   BILITOT 1.0 08/19/2014 0840       Total time spent on today's visit was 40 minutes, including both face-to-face time and nonface-to-face time.  Time included that spent on review of records (prior notes available to me/labs/imaging if pertinent), discussing treatment and goals, answering patient's questions and coordinating care.  Cc:  08/21/2014, PA-C

## 2019-11-29 ENCOUNTER — Other Ambulatory Visit: Payer: Self-pay

## 2019-11-29 ENCOUNTER — Ambulatory Visit: Payer: BC Managed Care – PPO | Admitting: Neurology

## 2019-11-29 ENCOUNTER — Other Ambulatory Visit: Payer: Self-pay | Admitting: Medical

## 2019-11-29 ENCOUNTER — Encounter: Payer: Self-pay | Admitting: Neurology

## 2019-11-29 VITALS — BP 136/89 | HR 106 | Ht 68.0 in | Wt 111.0 lb

## 2019-11-29 DIAGNOSIS — R1319 Other dysphagia: Secondary | ICD-10-CM

## 2019-11-29 DIAGNOSIS — G118 Other hereditary ataxias: Secondary | ICD-10-CM | POA: Diagnosis not present

## 2019-11-29 MED ORDER — ALPRAZOLAM 0.5 MG PO TABS: ORAL_TABLET | ORAL | 0 refills | Status: DC

## 2019-11-29 MED ORDER — BACLOFEN 10 MG PO TABS: 10.0000 mg | ORAL_TABLET | Freq: Three times a day (TID) | ORAL | 8 refills | Status: DC

## 2019-11-29 NOTE — Telephone Encounter (Addendum)
Very rare small number refill of xanax. 15 tab use in 6 months.

## 2019-11-29 NOTE — Patient Instructions (Addendum)
1.  I would recommend yoga/tai chi/etc to work on core strength 2.  We will refer you to PT,  You have been referred to Neuro Rehab for therapy. They will call you directly to schedule an appointment.  Please call [phone] if you do not hear from them.  3.  I would recommend you see ophthalmology [facility] ophthalmology is a good one) or neuro-ophthalmology  4.  We will schedule you for a modified barium swallow study.    The physicians and staff at [facility] are committed to providing excellent care. You may receive a survey requesting feedback about your experience at our office. We strive to receive "very good" responses to the survey questions. If you feel that your experience would prevent you from giving the office a "very good " response, please contact our office to try to remedy the situation. We may be reached at [phone]. Thank you for taking the time out of your busy day to complete the survey.

## 2019-11-29 NOTE — Telephone Encounter (Signed)
Last written: 05/27/19 Last ov: 12/06/18 Next ov: none Contract: none UDS: none

## 2019-12-01 ENCOUNTER — Other Ambulatory Visit (HOSPITAL_COMMUNITY): Payer: Self-pay

## 2019-12-01 DIAGNOSIS — R1310 Dysphagia, unspecified: Secondary | ICD-10-CM

## 2019-12-06 ENCOUNTER — Ambulatory Visit: Payer: BC Managed Care – PPO | Attending: Neurology | Admitting: Physical Therapy

## 2019-12-06 ENCOUNTER — Other Ambulatory Visit: Payer: Self-pay

## 2019-12-06 ENCOUNTER — Encounter: Payer: Self-pay | Admitting: Physical Therapy

## 2019-12-06 DIAGNOSIS — R42 Dizziness and giddiness: Secondary | ICD-10-CM | POA: Insufficient documentation

## 2019-12-06 DIAGNOSIS — R278 Other lack of coordination: Secondary | ICD-10-CM | POA: Diagnosis not present

## 2019-12-06 DIAGNOSIS — R260 Ataxic gait: Secondary | ICD-10-CM | POA: Diagnosis not present

## 2019-12-06 DIAGNOSIS — R29818 Other symptoms and signs involving the nervous system: Secondary | ICD-10-CM | POA: Insufficient documentation

## 2019-12-06 DIAGNOSIS — R2681 Unsteadiness on feet: Secondary | ICD-10-CM | POA: Diagnosis not present

## 2019-12-06 NOTE — Patient Instructions (Signed)
Resources for community Pilates and Yoga:  Pilates: [employer] Pilates; Karie Mainland; [email or web address]; PHONE: [phone]  Yoga: [employer]; Cathy Yonaitis ([place]); [email or web address].  [phone]

## 2019-12-06 NOTE — Therapy (Signed)
[facility] [address] [place], [place], [address] Phone: [phone]   Fax:  [phone]  Physical Therapy Evaluation  Patient Details  Name: Morgan Day MRN: [id number] Date of Birth: 10-30-1985 Referring Provider (PT): Tat, Octaviano Batty, DO   Encounter Date: 12/06/2019   PT End of Session - 12/06/19 1205    Visit Number 1    Number of Visits 7    Date for PT Re-Evaluation 01/20/20    Authorization Type BCBS; VL: PT/OT/ST 60, zero used    PT Start Time 1100    PT Stop Time 1145    PT Time Calculation (min) 45 min    Activity Tolerance Patient tolerated treatment well    Behavior During Therapy [facility] for tasks assessed/performed           Past Medical History:  Diagnosis Date  . Anxiety    DR CYNTHIA WH[facility] FOR [facility]  . Anxiety   . Asthma    exercise asthma  . Asthma   . Chronic kidney disease AGE 34   REFLUX  . Chronic kidney disease   . Depression AS  TEEN  . Depression   . Frequent UTI   . Infection    FREQUENT UTI A CHILD  . PONV (postoperative nausea and vomiting)   . Urinary reflux    as child    Past Surgical History:  Procedure Laterality Date  . HERNIA REPAIR    . MOUTH SURGERY    . NECK SURGERY    . THYROGLOSSAL DUCT CYST  05/09/2011  . THYROGLOSSAL DUCT CYST  05/09/2011   Procedure: THYROGLOSSAL DUCT CYST;  Surgeon: Osborn Coho, MD;  Location: [facility] OR;  Service: ENT;  Laterality: N/A;  . URINARY SURGERY     for reflux   as child    There were no vitals filed for this visit.    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  First symptoms - ataxia - appeared at 65.  All 4 siblings also have it, mother had it.  Pt understands this is progressive.  Pt is experiencing mm cramps, ataxia, impaired sleep, multiple near falls, impaired balance, diplopia, nystagmus, intermittent episodes of syncope, difficulty with  swallowing - has a MBS coming up, and decline in fine motor skills.    Pertinent History anxiety, asthma, CKD, depression, UTI, hernia repair, mouth surgery, neck surgery    Limitations Walking;House hold activities    Patient Stated Goals To work on balance and core; to maintain as long as she can    Currently in Pain? No/denies              [facility] PT Assessment - 12/06/19 1117      Assessment   Medical Diagnosis Spinocerebellar ataxia type 3    Referring Provider (PT) Tat, Octaviano Batty, DO    Onset Date/Surgical Date 11/29/19    Hand Dominance Right    Prior Therapy none      Precautions   Precautions Other (comment)    Precaution Comments  anxiety, asthma, CKD, depression, UTI, hernia repair, mouth surgery, neck surgery      Balance Screen   Has the patient fallen in the past 6 months No      Home Environment   Living Environment Private residence    Living Arrangements Spouse/significant other;Children;Other relatives    Type of Home House    Home Access Stairs to enter  Entrance Stairs-Number of Steps 3    Entrance Stairs-Rails Right;Left    Home Layout Multi-level    Alternate Level Stairs-Number of Steps 12 up and then 12 down to basement    Alternate Level Stairs-Rails Left    Home Equipment Grab bars - tub/shower;Shower seat    Additional Comments Father in law lives in the basement.        Prior Function   Level of Independence Independent    Vocation On disability    Leisure art, crafts, garden; is driving still      Observation/Other Assessments   Focus on Therapeutic Outcomes (FOTO)  N/A      Sensation   Light Touch Impaired by gross assessment    Additional Comments Decreased to LT R pointer finger and great toe; absent sensation tips of big toes      Coordination   Gross Motor Movements are Fluid and Coordinated No    Finger Nose Finger Test past pointing    Heel Shin Test mild dysmetria      ROM / Strength   AROM / PROM / Strength Strength       Strength   Overall Strength Within functional limits for tasks performed    Overall Strength Comments 5/5 bilat LE      Ambulation/Gait   Ambulation/Gait Yes    Ambulation/Gait Assistance 6: Modified independent (Device/Increase time)    Ambulation Distance (Feet) 115 Feet    Assistive device None    Gait Pattern Step-through pattern;Ataxic    Ambulation Surface Level;Indoor    Stairs Yes    Stairs Assistance 6: Modified independent (Device/Increase time)    Stair Management Technique Two rails;Alternating pattern;Forwards    Number of Stairs 4    Height of Stairs 6    Gait Comments Reports difficulty with descending stairs and inclines      Standardized Balance Assessment   Standardized Balance Assessment Berg Balance Test;Dynamic Gait Index      Berg Balance Test   Sit to Stand Able to stand without using hands and stabilize independently    Standing Unsupported Able to stand safely 2 minutes    Sitting with Back Unsupported but Feet Supported on Floor or Stool Able to sit safely and securely 2 minutes    Stand to Sit Sits safely with minimal use of hands    Transfers Able to transfer safely, minor use of hands    Standing Unsupported with Eyes Closed Able to stand 10 seconds with supervision    Standing Unsupported with Feet Together Able to place feet together independently and stand for 1 minute with supervision    From Standing, Reach Forward with Outstretched Arm Can reach confidently >25 cm (10")    From Standing Position, Pick up Object from Floor Able to pick up shoe safely and easily    From Standing Position, Turn to Look Behind Over each Shoulder Looks behind one side only/other side shows less weight shift    Turn 360 Degrees Needs assistance while turning    Standing Unsupported, Alternately Place Feet on Step/Stool Able to stand independently and safely and complete 8 steps in 20 seconds    Standing Unsupported, One Foot in Front Able to plae foot ahead of the  other independently and hold 30 seconds    Standing on One Leg Able to lift leg independently and hold 5-10 seconds    Total Score 47    Berg comment: 47/56      Dynamic Gait Index  Level Surface Mild Impairment    Change in Gait Speed Moderate Impairment    Gait with Horizontal Head Turns Severe Impairment    Gait with Vertical Head Turns Severe Impairment    Gait and Pivot Turn Moderate Impairment    Step Over Obstacle Mild Impairment    Step Around Obstacles Mild Impairment    Steps Mild Impairment    Total Score 10    DGI comment: 10/24                      Objective measurements completed on examination: See above findings.               PT Education - 12/06/19 1204    Education Details clinical findings, falls risk, PT POC and goals, options for community yoga and pilates with healthcare providers, aquatic therapy, speech therapy, OT, neuropsych as possible referrals    Person(s) Educated Patient    Methods Explanation    Comprehension Verbalized understanding               PT Long Term Goals - 12/06/19 1216      PT LONG TERM GOAL #1   Title Pt will demonstrate independence with HEP and will have looked into community wellness options (PT pilates, OT Yoga)    Time 6    Period Weeks    Status New    Target Date 01/20/20      PT LONG TERM GOAL #2   Title Pt will demonstrate 4 point improvement in BERG to indicate decreased falls risk    Baseline 47/56    Time 6    Period Weeks    Status New    Target Date 01/20/20      PT LONG TERM GOAL #3   Title Pt will demonstrate 4 point improvement in DGI to indicate decreased falls risk in the community    Baseline 10/24    Time 6    Period Weeks    Status New    Target Date 01/20/20      PT LONG TERM GOAL #4   Title Pt will negotiate 12 stairs x 2 with one rail, alternating sequence MOD I    Time 6    Period Weeks    Status New    Target Date 01/20/20      PT LONG TERM GOAL #5    Title Vestibular goal as needed                  Plan - 12/06/19 1208    Clinical Impression Statement Pt is a 34 year old female referred to Neuro OPPT for evaluation of impairments related to spinocerebellar ataxia type 3.  Pt's PMH is significant for the following: anxiety, asthma, CKD, depression, UTI, hernia repair, mouth surgery, neck surgery. The following deficits were noted during pt's exam: central vestibular impairments, ataxia, ataxic gait, impaired static and dynamic balance, impaired vision with h/o diplopia, and impaired sensation.  Pt's BERG and DGI scores indicate pt is at high risk for falls. Pt would benefit from skilled PT to address these impairments and functional limitations to maximize functional mobility independence and reduce falls risk.    Personal Factors and Comorbidities Comorbidity 2;Finances;Past/Current Experience    Comorbidities anxiety, asthma, CKD, depression, UTI, hernia repair, mouth surgery, neck surgery    Examination-Activity Limitations Locomotion Level;Stairs    Examination-Participation Restrictions Community Activity;Yard Work    Conservation officer, historic buildings Evolving/Moderate complexity    Clinical  Decision Making Moderate    Rehab Potential Fair    PT Frequency 1x / week    PT Duration 6 weeks    PT Treatment/Interventions ADLs/Self Care Home Management;Aquatic Therapy;DME Instruction;Gait training;Stair training;Functional mobility training;Therapeutic activities;Therapeutic exercise;Balance training;Neuromuscular re-education;Patient/family education;Vestibular;Visual/perceptual remediation/compensation    PT Next Visit Plan Perform vestibular eval.  Initiate HEP - balance, vestibular, habituation.    Recommended Other Services Speech, OT?  Neuropsych    Consulted and Agree with Plan of Care Patient           Patient will benefit from skilled therapeutic intervention in order to improve the following deficits and impairments:   Abnormal gait, Decreased balance, Decreased coordination, Difficulty walking, Dizziness, Impaired sensation, Impaired vision/preception  Visit Diagnosis: Ataxic gait  Other lack of coordination  Dizziness and giddiness  Unsteadiness on feet  Other symptoms and signs involving the nervous system     Problem List Patient Active Problem List   Diagnosis Date Noted  . SCA-3 (spinocerebellar ataxia type 3) (HCC) 08/27/2018  . Weight loss 08/27/2018  . UTI (urinary tract infection) 07/26/2014  . Generalized anxiety disorder 06/26/2014  . Ventral hernia 06/26/2014  . Knee pain, left 06/26/2014  . Migraine 06/26/2014  . Syncope 06/26/2014  . History of UTI 06/26/2014  . Anxiety 12/17/2011  . Family hx Ataxia 12/17/2011  . LGA (large for gestational age) fetus 12/17/2011  . Pregnant state, incidental 11/06/2011    Dierdre HighmanAudra F Layah Skousen, PT, DPT 12/06/19    12:23 PM    [facility] [address] [place], [place], [address] Phone: [phone]   Fax:  [phone]  Name: Morgan Day MRN: [id number] Date of Birth: 04/16/1985

## 2019-12-08 ENCOUNTER — Other Ambulatory Visit: Payer: Self-pay

## 2019-12-08 ENCOUNTER — Ambulatory Visit (HOSPITAL_COMMUNITY)
Admission: RE | Admit: 2019-12-08 | Discharge: 2019-12-08 | Disposition: A | Discharge status: Home / Self Care | Payer: BC Managed Care – PPO | Source: Ambulatory Visit | Attending: Neurology | Admitting: Neurology

## 2019-12-08 DIAGNOSIS — R1310 Dysphagia, unspecified: Secondary | ICD-10-CM | POA: Insufficient documentation

## 2019-12-08 NOTE — Progress Notes (Signed)
Modified Barium Swallow Progress Note  Patient Details  Name: Morgan Day MRN: [id number] Date of Birth: Aug 18, 1985  Today's Date: 12/08/2019  Modified Barium Swallow completed.  Full report located under Chart Review in the Imaging Section.  Brief recommendations include the following:  Clinical Impression   Pt has a functional oropharyngeal swallow with good efficiency and no aspiration. During oral motor exam she was noted to have tremors of her tongue and lower face while activating her muscles, and her uvula was deviated to her right. Her hyoid had previous been removed, so this cannot be used as an indicator for swallow initiation, but this appeared to be timely. Recommend continuing with regular solids and thin liquids. Strategies were offered for management of finer particles.   Swallow Evaluation Recommendations       SLP Diet Recommendations: Regular solids;Thin liquid   Liquid Administration via: Cup;Straw   Medication Administration: Whole meds with liquid   Supervision: Patient able to self feed       Postural Changes: Seated upright at 90 degrees   Oral Care Recommendations: Oral care BID        Mahala Menghini., M.A. CCC-SLP Acute Rehabilitation Services Pager [phone] Office [phone]  12/08/2019,12:58 PM

## 2019-12-22 ENCOUNTER — Ambulatory Visit: Payer: BC Managed Care – PPO | Attending: Neurology | Admitting: Physical Therapy

## 2019-12-22 ENCOUNTER — Other Ambulatory Visit: Payer: Self-pay

## 2019-12-22 ENCOUNTER — Other Ambulatory Visit: Payer: Self-pay | Admitting: Neurology

## 2019-12-22 DIAGNOSIS — R42 Dizziness and giddiness: Secondary | ICD-10-CM | POA: Insufficient documentation

## 2019-12-22 DIAGNOSIS — R29818 Other symptoms and signs involving the nervous system: Secondary | ICD-10-CM | POA: Diagnosis not present

## 2019-12-22 DIAGNOSIS — R260 Ataxic gait: Secondary | ICD-10-CM | POA: Insufficient documentation

## 2019-12-22 DIAGNOSIS — R2681 Unsteadiness on feet: Secondary | ICD-10-CM | POA: Diagnosis not present

## 2019-12-22 DIAGNOSIS — R278 Other lack of coordination: Secondary | ICD-10-CM | POA: Insufficient documentation

## 2019-12-22 MED ORDER — UNABLE TO FIND: 0 refills | Status: DC

## 2019-12-22 NOTE — Patient Instructions (Signed)
Heel Raise: Bilateral (Standing)    Rise on balls of feet. Repeat __10__ times per set. Do _1-3___ sets per session. Do __1__ sessions per day.   ALSO each leg separately - 10 reps each; keep knee bent  http://orth.exer.us/39   Copyright  VHI. All rights reserved.     Gastroc / Heel Cord Stretch - On Step    Stand with heels over edge of stair. Holding rail, lower heels until stretch is felt in calf of legs. Repeat _2__ times. Do _2__ times per day. Hold for 30-60 secs  Copyright  VHI. All rights reserved.    Heel Cord Stretch    Place one leg forward, bent, other leg behind and straight. Lean forward keeping back heel flat. Hold _30-60___ seconds while counting out loud. Repeat with other leg. Repeat __2__ times. Do __1__ sessions per day.  http://gt2.exer.us/512   Copyright  VHI. All rights reserved.     Standing Marching   Using a chair if necessary, march in place. Repeat 10 times. Do 1 sessions per day.  Also do marching forward and backward along counter  http://gt2.exer.us/344    Hip Backward Kick   Using a chair for balance, keep legs shoulder width apart and toes pointed for- ward. Slowly extend one leg back, keeping knee straight. Do not lean forward. Repeat with other leg. Repeat 10 times. Do 1 sessions per day.  http://gt2.exer.us/340   Copyright  VHI. All rights reserved.     Hip Side Kick   Holding a chair for balance, keep legs shoulder width apart and toes pointed forward. Swing a leg out to side, keeping knee straight. Do not lean. Repeat using other leg. Repeat 10  times. Do 1 sessions per day.  ALSO DO FORWARD KICKS - 10 reps each leg alternating   Feet Heel-Toe "Tandem", Varied Arm Positions - Eyes Open - partial heel toe   With eyes open, right foot directly in front of the other, arms out, look straight ahead at a stationary object. Hold  30 seconds. Repeat 1 times per session. Do 1 sessions per day.  Copyright   VHI. All rights reserved.       Standing On One Leg Without Support .  Stand on one leg in neutral spine without support. Hold 10 seconds. Repeat on other leg. Do 2  repetitions, 1 sets.  http://bt.exer.us/36   Copyright  VHI. All rights reserved.     AMBULATION: Walk Backward   Walk backward. Take large steps, do not drag feet. 1  reps per set, 1 sets per day, 5 days per week Use assistive device.  Copyright  VHI. All rights reserved.      Braiding   Move to side: 1) cross right leg in front of left, 2) bring back leg out to side, then 3) cross right leg behind left, 4) bring left leg out to side. Continue sequence in same direction. Reverse sequence, moving in opposite direction. Repeat sequence 3 times per session. Do 1 sessions per day.

## 2019-12-22 NOTE — Telephone Encounter (Signed)
Rx(s) sent to pharmacy electronically.  

## 2019-12-23 ENCOUNTER — Other Ambulatory Visit: Payer: Self-pay | Admitting: Neurology

## 2019-12-23 ENCOUNTER — Encounter: Payer: Self-pay | Admitting: Physical Therapy

## 2019-12-23 NOTE — Therapy (Signed)
[facility] [address] [place], [place], [address] Phone: [phone]   Fax:  [phone]  Physical Therapy Treatment  Patient Details  Name: Morgan Day MRN: [id number] Date of Birth: 11-Jul-1985 Referring Provider (PT): Tat, Octaviano Batty, DO   Encounter Date: 12/22/2019   PT End of Session - 12/23/19 1910    Visit Number 2    Number of Visits 7    Date for PT Re-Evaluation 01/20/20    Authorization Type BCBS; VL: PT/OT/ST 60, zero used    PT Start Time 0933    PT Stop Time 1015    PT Time Calculation (min) 42 min    Activity Tolerance Patient tolerated treatment well    Behavior During Therapy [facility] for tasks assessed/performed           Past Medical History:  Diagnosis Date  . Anxiety    DR CYNTHIA WHITE FOR MED  . Anxiety   . Asthma    exercise asthma  . Asthma   . Chronic kidney disease AGE 13   REFLUX  . Chronic kidney disease   . Depression AS  TEEN  . Depression   . Frequent UTI   . Infection    FREQUENT UTI A CHILD  . PONV (postoperative nausea and vomiting)   . Urinary reflux    as child    Past Surgical History:  Procedure Laterality Date  . HERNIA REPAIR    . MOUTH SURGERY    . NECK SURGERY    . THYROGLOSSAL DUCT CYST  05/09/2011  . THYROGLOSSAL DUCT CYST  05/09/2011   Procedure: THYROGLOSSAL DUCT CYST;  Surgeon: Osborn Coho, MD;  [facility] OR;  Service: ENT;  Laterality: N/A;  . URINARY SURGERY     for reflux   as child    There were no vitals filed for this visit.            Vestibular Assessment - 12/23/19 0001      Oculomotor Exam   Spontaneous Direction changing nystagmus                    [facility] Adult PT Treatment/Exercise - 12/23/19 0001      Exercises   Exercises Knee/Hip;Ankle      Knee/Hip Exercises: Stretches   Active Hamstring Stretch Right;Left;1 rep;30 seconds   Runner's stretch 1 rep each leg   Gastroc Stretch Both;1 rep;30  seconds   heels off edge of step     Knee/Hip Exercises: Standing   Heel Raises Both;1 set;10 reps   unilateral 10 reps each leg          Vestibular Treatment/Exercise - 12/23/19 0001      Vestibular Treatment/Exercise   Gaze Exercises X1 Viewing Horizontal   attempted - pt unable to do correctly due to nystagmus         Pt performed exercises for balance HEP - forward, back and side kicks 10 reps each;  Marching in place 10 reps, progressing To marching forwards along counter, backwards along counter; partial tandem stance; SLS Crossovers front 2 reps along counter; stepping behind 2 reps along counter - then combining for braiding 2 reps along counter With UE support prn    Balance Exercises - 12/23/19 0001      Balance Exercises: Standing   Standing Eyes Opened Narrow base of support (BOS);Solid surface;1 rep   standing for 1"    Marching Solid surface;Static;Dynamic;10 reps   10 reps each  leg            PT Education - 12/23/19 1910    Education Details Bil. heel cord stretch, heel raise, and balance HEP    Person(s) Educated Patient    Methods Explanation;Demonstration;Handout    Comprehension Verbalized understanding;Returned demonstration               PT Long Term Goals - 12/23/19 1916      PT LONG TERM GOAL #1   Title Pt will demonstrate independence with HEP and will have looked into community wellness options (PT pilates, OT Yoga)    Time 6    Period Weeks    Status New      PT LONG TERM GOAL #2   Title Pt will demonstrate 4 point improvement in BERG to indicate decreased falls risk    Baseline 47/56    Time 6    Period Weeks    Status New      PT LONG TERM GOAL #3   Title Pt will demonstrate 4 point improvement in DGI to indicate decreased falls risk in the community    Baseline 10/24    Time 6    Period Weeks    Status New      PT LONG TERM GOAL #4   Title Pt will negotiate 12 stairs x 2 with one rail, alternating sequence MOD I     Time 6    Period Weeks    Status New      PT LONG TERM GOAL #5   Title Vestibular goal as needed                 Plan - 12/23/19 1911    Clinical Impression Statement Oculomotor exam was attempted but pt has spontaneous nystagmus with all testing;  trialed x 1 viewing to assess pt's response but pt reported double vision so this exercise was discontinued as pt is unable to perform exercise correctly due to nystagmus.  Pt was instructed in balance HEP but needs UE support due to SLS deficits on each leg and also due to decreased core stabilization. Cont with POC.    Personal Factors and Comorbidities Comorbidity 2;Finances;Past/Current Experience    Comorbidities anxiety, asthma, CKD, depression, UTI, hernia repair, mouth surgery, neck surgery    Examination-Activity Limitations Locomotion Level;Stairs    Examination-Participation Restrictions Community Activity;Yard Work    Conservation officer, historic buildings Evolving/Moderate complexity    Rehab Potential Fair    PT Frequency 1x / week    PT Duration 6 weeks    PT Treatment/Interventions ADLs/Self Care Home Management;Aquatic Therapy;DME Instruction;Gait training;Stair training;Functional mobility training;Therapeutic activities;Therapeutic exercise;Balance training;Neuromuscular re-education;Patient/family education;Vestibular;Visual/perceptual remediation/compensation    PT Next Visit Plan Check balance HEP issued on 12-22-19; cont with LE stretching, core stabilization and balance exercises    PT Home Exercise Plan balance HEP    Consulted and Agree with Plan of Care Patient           Patient will benefit from skilled therapeutic intervention in order to improve the following deficits and impairments:  Abnormal gait, Decreased balance, Decreased coordination, Difficulty walking, Dizziness, Impaired sensation, Impaired vision/preception  Visit Diagnosis: Unsteadiness on feet  Dizziness and giddiness  Other symptoms and  signs involving the nervous system     Problem List Patient Active Problem List   Diagnosis Date Noted  . SCA-3 (spinocerebellar ataxia type 3) (HCC) 08/27/2018  . Weight loss 08/27/2018  . UTI (urinary tract infection) 07/26/2014  . Generalized anxiety disorder  06/26/2014  . Ventral hernia 06/26/2014  . Knee pain, left 06/26/2014  . Migraine 06/26/2014  . Syncope 06/26/2014  . History of UTI 06/26/2014  . Anxiety 12/17/2011  . Family hx Ataxia 12/17/2011  . LGA (large for gestational age) fetus 12/17/2011  . Pregnant state, incidental 11/06/2011    DildayDonavan Burnet, PT 12/23/2019, 7:19 PM  [facility] [place] [facility] [address] [place], [place], [address] Phone: [phone]   Fax:  [phone]  Name: Morgan Day MRN: [id number] Date of Birth: [birth year]

## 2019-12-23 NOTE — Telephone Encounter (Signed)
Duplicate request.   Rx called in yesterday.

## 2019-12-29 ENCOUNTER — Ambulatory Visit: Payer: BC Managed Care – PPO

## 2019-12-29 ENCOUNTER — Other Ambulatory Visit: Payer: Self-pay

## 2019-12-29 DIAGNOSIS — R29818 Other symptoms and signs involving the nervous system: Secondary | ICD-10-CM

## 2019-12-29 DIAGNOSIS — R278 Other lack of coordination: Secondary | ICD-10-CM

## 2019-12-29 DIAGNOSIS — R2681 Unsteadiness on feet: Secondary | ICD-10-CM | POA: Diagnosis not present

## 2019-12-29 DIAGNOSIS — R260 Ataxic gait: Secondary | ICD-10-CM

## 2019-12-29 DIAGNOSIS — R42 Dizziness and giddiness: Secondary | ICD-10-CM | POA: Diagnosis not present

## 2019-12-29 NOTE — Therapy (Signed)
[facility] [address] [place], [place], [address] Phone: [phone]   Fax:  [phone]  Physical Therapy Treatment  Patient Details  Name: Morgan Day MRN: [id number] Date of Birth: 02/17/1985 Referring Provider (PT): Tat, Octaviano Batty, DO   Encounter Date: 12/29/2019   PT End of Session - 12/29/19 1552    Visit Number 3    Number of Visits 7    Date for PT Re-Evaluation 01/20/20    Authorization Type BCBS; VL: PT/OT/ST 60, zero used    Authorization - Visit Number 3    Authorization - Number of Visits 60    Progress Note Due on Visit 10    PT Start Time 0932    PT Stop Time 1012    PT Time Calculation (min) 40 min    Equipment Utilized During Treatment Other (comment)   min guard to S prn   Activity Tolerance Patient tolerated treatment well    Behavior During Therapy [facility] for tasks assessed/performed           Past Medical History:  Diagnosis Date  . Anxiety    DR CYNTHIA WHITE FOR [facility]  . Anxiety   . Asthma    exercise asthma  . Asthma   . Chronic kidney disease AGE 43   REFLUX  . Chronic kidney disease   . Depression AS  TEEN  . Depression   . Frequent UTI   . Infection    FREQUENT UTI A CHILD  . PONV (postoperative nausea and vomiting)   . Urinary reflux    as child    Past Surgical History:  Procedure Laterality Date  . HERNIA REPAIR    . MOUTH SURGERY    . NECK SURGERY    . THYROGLOSSAL DUCT CYST  05/09/2011  . THYROGLOSSAL DUCT CYST  05/09/2011   Procedure: THYROGLOSSAL DUCT CYST;  Surgeon: Osborn Coho, MD;  Location: [facility] OR;  Service: ENT;  Laterality: N/A;  . URINARY SURGERY     for reflux   as child    There were no vitals filed for this visit.   Subjective Assessment - 12/29/19 0936    Subjective Pt denied falls or changes since last visit. She has performed her HEP most days.    Pertinent History anxiety, asthma, CKD, depression, UTI, hernia repair, mouth surgery,  neck surgery    Currently in Pain? Yes    Pain Score 0-No pain    Pain Location Teeth    Pain Orientation Right   right filling fell out and root is exposed   Pain Descriptors / Indicators Aching    Pain Type Acute pain    Pain Onset In the past 7 days    Pain Frequency Intermittent    Aggravating Factors  Extreme temperatures    Pain Relieving Factors rest                       Therex and NMR:  Heel Raise: Bilateral (Standing)    Rise on balls of feet. Hold for 1-2 seconds, then slowly come back down. Repeat __10__ times per set. Do _1-3___ sets per session. Do __1__ sessions per day. Also, try on one leg-holding counter as needed.   ALSO each leg separately - 10 reps each; keep knee bent  http://orth.exer.us/39   Copyright  VHI. All rights reserved.     Gastroc / Heel Cord Stretch - On Step    Stand with heels over  edge of stair. Holding rail, lower heels until stretch is felt in calf of legs. Repeat _2__ times. Do _2__ times per day. Hold for 30-60 secs  Copyright  VHI. All rights reserved.    Heel Cord Stretch    Place one leg forward, bent, other leg behind and straight. Lean forward keeping back heel flat. Hold _30-60___ seconds while counting out loud. Repeat with other leg. Repeat __2__ times. Do __1__ sessions per day.  http://[email or web address].exer.us/512   Copyright  VHI. All rights reserved.     Standing Marching   Using a chair if necessary, march in place. Repeat 10 times. Do 1 sessions per day. Make sure to go slow, don't let hip drop, and don't let legs touch. Also do marching forward and backward along counter.  http://[email or web address].exer.us/344    Hip Backward Kick   Using a chair for balance, keep legs shoulder width apart and toes pointed for- ward. Slowly extend one leg back, keeping knee straight. Do not lean forward. Repeat with other leg. Repeat 10 times. Do 1 sessions per day.  http://[email or web address].exer.us/340    Copyright  VHI. All rights reserved.     Hip Side Kick   Holding a chair for balance, keep legs shoulder width apart and toes pointed forward. Take half a step back with kicking leg, make sure hips and toes point forward. Swing a leg out to side, keeping knee straight. Do not lean. Repeat using other leg. Repeat 10  times. Do 1 sessions per day.  ALSO DO FORWARD KICKS - 10 reps each leg alternating   Feet Heel-Toe "Tandem", Varied Arm Positions - Eyes Open - partial heel toe   With eyes open, right foot directly in front of the other, arms out, look straight ahead at a stationary object. Hold  30 seconds. Repeat 1 times per session. Do 1 sessions per day.  Copyright  VHI. All rights reserved.       Standing On One Leg Without Support .  Stand on one leg in neutral spine without support. Hold 10 seconds. Repeat on other leg. Do 2  repetitions, 1 sets.  http://bt.exer.us/36   Copyright  VHI. All rights reserved.     AMBULATION: Walk Backward   Walk backward. Take large steps, do not drag feet. 1  reps per set, 1 sets per day, 5 days per week Use assistive device.  Copyright  VHI. All rights reserved.      Braiding   Move to side: 1) cross right leg in front of left, 2) bring back leg out to side, then 3) cross right leg behind left, 4) bring left leg out to side. Continue sequence in same direction. Reverse sequence, moving in opposite direction. Repeat sequence 3 times per session. Do 1 sessions per day.         Bridge    Lying on back, legs bent 90, feet flat on floor. Tuck in your stomach first-like you're trying to not go to the bathroom, then Press up hips and torso, hold for 2 seconds. Slowly come back down. Perform 3 sets of 10 reps, or 2 sets of 10 reps if hamstring muscles spasm.   Copyright  VHI. All rights reserved.   Upper Extremity Extension (All-Fours)    Tighten stomach and raise left arm parallel to floor.  Keep core engaged, slowly lower arm and repeat with other side. Repeat 2 sets of 5 reps. Then perform sliding right foot back, hold, and then return to starting position. Repeat with left  side.  Perform 3 days a week.http://orth.exer.us/1117   Copyright  VHI. All rights reserved.   Hip Flexor Stretch    Lying on back near edge of bed, bend one leg, foot flat. Hang other leg over edge, relaxed, thigh resting entirely on bed for __1-2__ minutes. Repeat __3__ times. Do __2-3__ sessions per day. Advanced Exercise: Bend knee back keeping thigh in contact with bed.  http://[email or web address].exer.us/347   Copyright  VHI. All rights reserved.    Perform strengthening exercises 3 days a week. Stretches daily and balance activities daily.   All previous HEP reviewed and performed x10 reps at counter with min guard to S for safety. Cues for proper technique and improved eccentric control as indicated. Added new therex and NMR activities to HEP as indicated.               PT Education - 12/29/19 1552    Education Details PT reviewed HEP and added to it as indicated.    Person(s) Educated Patient    Methods Explanation;Demonstration;Tactile cues;Verbal cues;Handout    Comprehension Verbalized understanding;Returned demonstration               PT Long Term Goals - 12/23/19 1916      PT LONG TERM GOAL #1   Title Pt will demonstrate independence with HEP and will have looked into community wellness options (PT pilates, OT Yoga)    Time 6    Period Weeks    Status New      PT LONG TERM GOAL #2   Title Pt will demonstrate 4 point improvement in BERG to indicate decreased falls risk    Baseline 47/56    Time 6    Period Weeks    Status New      PT LONG TERM GOAL #3   Title Pt will demonstrate 4 point improvement in DGI to indicate decreased falls risk in the community    Baseline 10/24    Time 6    Period Weeks    Status New      PT LONG TERM GOAL #4   Title Pt will negotiate  12 stairs x 2 with one rail, alternating sequence MOD I    Time 6    Period Weeks    Status New      PT LONG TERM GOAL #5   Title Vestibular goal as needed                 Plan - 12/29/19 1554    Clinical Impression Statement Today's skilled session focused on strengthening, core activation and balance. Pt continues to have spontaneous nystagmus at rest and with activity. Pt demonstrated progress during session as she required less cues during therex and less assistance and UE support during balance activities. Pt would continue to benefit from skilled PT to improve safety during functional mobility.    Personal Factors and Comorbidities Comorbidity 2;Finances;Past/Current Experience    Comorbidities anxiety, asthma, CKD, depression, UTI, hernia repair, mouth surgery, neck surgery    Examination-Activity Limitations Locomotion Level;Stairs    Examination-Participation Restrictions Community Activity;Yard Work    Conservation officer, historic buildings Evolving/Moderate complexity    Rehab Potential Fair    PT Frequency 1x / week    PT Duration 6 weeks    PT Treatment/Interventions ADLs/Self Care Home Management;Aquatic Therapy;DME Instruction;Gait training;Stair training;Functional mobility training;Therapeutic activities;Therapeutic exercise;Balance training;Neuromuscular re-education;Patient/family education;Vestibular;Visual/perceptual remediation/compensation    PT Next Visit Plan Review new HEP exercises as needed. cont with LE stretching, core stabilization and  balance exercises    PT Home Exercise Plan balance HEP    Consulted and Agree with Plan of Care Patient           Patient will benefit from skilled therapeutic intervention in order to improve the following deficits and impairments:  Abnormal gait,Decreased balance,Decreased coordination,Difficulty walking,Dizziness,Impaired sensation,Impaired vision/preception  Visit Diagnosis: Unsteadiness on feet  Other lack of  coordination  Dizziness and giddiness  Other symptoms and signs involving the nervous system  Ataxic gait     Problem List Patient Active Problem List   Diagnosis Date Noted  . SCA-3 (spinocerebellar ataxia type 3) (HCC) 08/27/2018  . Weight loss 08/27/2018  . UTI (urinary tract infection) 07/26/2014  . Generalized anxiety disorder 06/26/2014  . Ventral hernia 06/26/2014  . Knee pain, left 06/26/2014  . Migraine 06/26/2014  . Syncope 06/26/2014  . History of UTI 06/26/2014  . Anxiety 12/17/2011  . Family hx Ataxia 12/17/2011  . LGA (large for gestational age) fetus 12/17/2011  . Pregnant state, incidental 11/06/2011    Maansi Wike,Lear L 12/29/2019, 4:09 PM  [facility] [address] [place], [place], [address] Phone: [phone]   Fax:  [phone]  Name: Fayne MediateJennifer McAdams Banker MRN: [id number] Date of Birth: 12/29/1985  Zerita BoersJennifer Daeron Carreno, PT,DPT 12/29/19 4:11 PM Phone: [phone] Fax: [phone]

## 2019-12-29 NOTE — Patient Instructions (Signed)
Heel Raise: Bilateral (Standing)    Rise on balls of feet. Hold for 1-2 seconds, then slowly come back down. Repeat __10__ times per set. Do _1-3___ sets per session. Do __1__ sessions per day. Also, try on one leg-holding counter as needed.   ALSO each leg separately - 10 reps each; keep knee bent  http://orth.exer.us/39   Copyright  VHI. All rights reserved.     Gastroc / Heel Cord Stretch - On Step    Stand with heels over edge of stair. Holding rail, lower heels until stretch is felt in calf of legs. Repeat _2__ times. Do _2__ times per day. Hold for 30-60 secs  Copyright  VHI. All rights reserved.    Heel Cord Stretch    Place one leg forward, bent, other leg behind and straight. Lean forward keeping back heel flat. Hold _30-60___ seconds while counting out loud. Repeat with other leg. Repeat __2__ times. Do __1__ sessions per day.  http://[email or web address].exer.us/512   Copyright  VHI. All rights reserved.     Standing Marching   Using a chair if necessary, march in place. Repeat 10 times. Do 1 sessions per day. Make sure to go slow, don't let hip drop, and don't let legs touch. Also do marching forward and backward along counter.  http://[email or web address].exer.us/344    Hip Backward Kick   Using a chair for balance, keep legs shoulder width apart and toes pointed for- ward. Slowly extend one leg back, keeping knee straight. Do not lean forward. Repeat with other leg. Repeat 10 times. Do 1 sessions per day.  http://[email or web address].exer.us/340   Copyright  VHI. All rights reserved.     Hip Side Kick   Holding a chair for balance, keep legs shoulder width apart and toes pointed forward. Take half a step back with kicking leg, make sure hips and toes point forward. Swing a leg out to side, keeping knee straight. Do not lean. Repeat using other leg. Repeat 10  times. Do 1 sessions per day.  ALSO DO FORWARD KICKS - 10 reps each leg alternating   Feet  Heel-Toe "Tandem", Varied Arm Positions - Eyes Open - partial heel toe   With eyes open, right foot directly in front of the other, arms out, look straight ahead at a stationary object. Hold  30 seconds. Repeat 1 times per session. Do 1 sessions per day.  Copyright  VHI. All rights reserved.       Standing On One Leg Without Support .  Stand on one leg in neutral spine without support. Hold 10 seconds. Repeat on other leg. Do 2  repetitions, 1 sets.  http://bt.exer.us/36   Copyright  VHI. All rights reserved.     AMBULATION: Walk Backward   Walk backward. Take large steps, do not drag feet. 1  reps per set, 1 sets per day, 5 days per week Use assistive device.  Copyright  VHI. All rights reserved.      Braiding   Move to side: 1) cross right leg in front of left, 2) bring back leg out to side, then 3) cross right leg behind left, 4) bring left leg out to side. Continue sequence in same direction. Reverse sequence, moving in opposite direction. Repeat sequence 3 times per session. Do 1 sessions per day.         Bridge    Lying on back, legs bent 90, feet flat on floor. Tuck in your stomach first-like you're trying to not go to the bathroom, then Press up hips  and torso, hold for 2 seconds. Slowly come back down. Perform 3 sets of 10 reps, or 2 sets of 10 reps if hamstring muscles spasm.   Copyright  VHI. All rights reserved.   Upper Extremity Extension (All-Fours)    Tighten stomach and raise left arm parallel to floor. Keep core engaged, slowly lower arm and repeat with other side. Repeat 2 sets of 5 reps. Then perform sliding right foot back, hold, and then return to starting position. Repeat with left side.  Perform 3 days a week.http://orth.exer.us/1117   Copyright  VHI. All rights reserved.   Hip Flexor Stretch    Lying on back near edge of bed, bend one leg, foot flat. Hang other leg over edge, relaxed, thigh resting entirely on  bed for __1-2__ minutes. Repeat __3__ times. Do __2-3__ sessions per day. Advanced Exercise: Bend knee back keeping thigh in contact with bed.  http://[email or web address].exer.us/347   Copyright  VHI. All rights reserved.    Perform strengthening exercises 3 days a week. Stretches daily and balance activities daily.

## 2020-01-06 ENCOUNTER — Encounter: Payer: Self-pay | Admitting: Physical Therapy

## 2020-01-06 ENCOUNTER — Other Ambulatory Visit: Payer: Self-pay

## 2020-01-06 ENCOUNTER — Ambulatory Visit: Payer: BC Managed Care – PPO | Admitting: Physical Therapy

## 2020-01-06 DIAGNOSIS — R2681 Unsteadiness on feet: Secondary | ICD-10-CM

## 2020-01-06 DIAGNOSIS — R278 Other lack of coordination: Secondary | ICD-10-CM

## 2020-01-06 DIAGNOSIS — R29818 Other symptoms and signs involving the nervous system: Secondary | ICD-10-CM | POA: Diagnosis not present

## 2020-01-06 DIAGNOSIS — R260 Ataxic gait: Secondary | ICD-10-CM | POA: Diagnosis not present

## 2020-01-06 DIAGNOSIS — R42 Dizziness and giddiness: Secondary | ICD-10-CM

## 2020-01-06 NOTE — Patient Instructions (Addendum)
Gaze Stabilization: Sitting    One eye patched.  Keeping eyes on target on wall 3 feet away, and move head side to side for _30___ seconds. Repeat while moving head up and down for __30__ seconds.  Switch the patch to the other side and repeat. Do __2-3__ sessions per day.  Copyright  VHI. All rights reserved.   Gaze Stabilization: Tip Card  1.Target must remain in focus, not blurry, and appear stationary while head is in motion. 2.Perform exercises with small head movements (45 to either side of midline). 3.Increase speed of head motion so long as target is in focus. 4.If you wear eyeglasses, be sure you can see target through lens (therapist will give specific instructions for bifocal / progressive lenses). 5.These exercises may provoke dizziness or nausea. Work through these symptoms. If too dizzy, slow head movement slightly. Rest between each exercise. 6.Exercises demand concentration; avoid distractions.  Copyright  VHI. All rights reserved.

## 2020-01-06 NOTE — Therapy (Signed)
[facility] [address] [place], [place], [address] Phone: [phone]   Fax:  [phone]  Physical Therapy Treatment  Patient Details  Name: Morgan Day MRN: [id number] Date of Birth: 05-28-1985 Referring Provider (PT): Tat, Octaviano Batty, DO   Encounter Date: 01/06/2020   PT End of Session - 01/06/20 1120    Visit Number 4    Number of Visits 7    Date for PT Re-Evaluation 01/20/20    Authorization Type BCBS; VL: PT/OT/ST 60, zero used    Authorization - Visit Number 4    Authorization - Number of Visits 60    Progress Note Due on Visit 10    PT Start Time 1016    PT Stop Time 1100    PT Time Calculation (min) 44 min    Activity Tolerance Patient tolerated treatment well    Behavior During Therapy [facility] for tasks assessed/performed           Past Medical History:  Diagnosis Date  . Anxiety    DR CYNTHIA WHITE FOR MED  . Anxiety   . Asthma    exercise asthma  . Asthma   . Chronic kidney disease AGE 21   REFLUX  . Chronic kidney disease   . Depression AS  TEEN  . Depression   . Frequent UTI   . Infection    FREQUENT UTI A CHILD  . PONV (postoperative nausea and vomiting)   . Urinary reflux    as child    Past Surgical History:  Procedure Laterality Date  . HERNIA REPAIR    . MOUTH SURGERY    . NECK SURGERY    . THYROGLOSSAL DUCT CYST  05/09/2011  . THYROGLOSSAL DUCT CYST  05/09/2011   Procedure: THYROGLOSSAL DUCT CYST;  Surgeon: Osborn Coho, MD;  Location: [facility] OR;  Service: ENT;  Laterality: N/A;  . URINARY SURGERY     for reflux   as child    There were no vitals filed for this visit.   Subjective Assessment - 01/06/20 1023    Subjective Has been performing stretches every day, strengthening exercises almost every day.  Felt her balance was better yesterday.    Pertinent History anxiety, asthma, CKD, depression, UTI, hernia repair, mouth surgery, neck surgery    Currently in  Pain? No/denies    Pain Onset In the past 7 days                   Vestibular Assessment - 01/06/20 1032      Symptom Behavior   Type of Dizziness  Imbalance;Diplopia;Blurred vision   disoriented   Frequency of Dizziness intermittent    Symptom Nature Motion provoked    Aggravating Factors Activity in general    Relieving Factors Slow movements      Oculomotor Exam   Oculomotor Alignment Abnormal    Spontaneous Direction changing nystagmus    Gaze-induced  Direction changing nystagmus    Smooth Pursuits Intact    Saccades Poor trajectory;Dysmetria      Oculomotor Exam-Fixation Suppressed    Left Head Impulse positive    Right Head Impulse positive      Vestibulo-Ocular Reflex   VOR to Slow Head Movement Normal    VOR Cancellation Corrective saccades      Positional Sensitivities   Nose to Right Knee No dizziness    Right Knee to Sitting No dizziness    Nose to Left Knee No dizziness  Left Knee to Sitting No dizziness    Head Turning x 5 Mild dizziness    Head Nodding x 5 Mild dizziness    Pivot Right in Standing Lightheadedness   performs slowly   Pivot Left in Standing Lightheadedness   performs slowly                   [facility] Adult PT Treatment/Exercise - 01/06/20 1026      Therapeutic Activites    Therapeutic Activities Other Therapeutic Activities    Other Therapeutic Activities Gardening bench/kneeler: demonstrated use to pt and provided with information for purchase to allow pt to continue to participate in yard work or household chores safely.           Vestibular Treatment/Exercise - 01/06/20 1051      Vestibular Treatment/Exercise   Vestibular Treatment Provided Gaze    Gaze Exercises X1 Viewing Horizontal;X1 Viewing Vertical      X1 Viewing Horizontal   Foot Position seated, one eye patched    Reps 1    Comments 30 seconds      X1 Viewing Vertical   Foot Position seated, one eye patched    Reps 1    Comments 30 seconds                  PT Education - 01/06/20 1120    Education Details eye patch for x1 viewing; gardening/kneeling bench    Person(s) Educated Patient    Methods Explanation    Comprehension Verbalized understanding               PT Long Term Goals - 01/06/20 1127      PT LONG TERM GOAL #1   Title Pt will demonstrate independence with HEP and will have looked into community wellness options (PT pilates, OT Yoga)  (LTG due 12/31)    Time 6    Period Weeks    Status New      PT LONG TERM GOAL #2   Title Pt will demonstrate 4 point improvement in BERG to indicate decreased falls risk    Baseline 47/56    Time 6    Period Weeks    Status New      PT LONG TERM GOAL #3   Title Pt will demonstrate 4 point improvement in DGI to indicate decreased falls risk in the community    Baseline 10/24    Time 6    Period Weeks    Status New      PT LONG TERM GOAL #4   Title Pt will negotiate 12 stairs x 2 with one rail, alternating sequence MOD I    Time 6    Period Weeks    Status New      PT LONG TERM GOAL #5   Title With VOR training and use of compensatory training pt will report decreased motion sensitivity on MSQ    Time 6    Period Weeks    Status New    Target Date 01/20/20                 Plan - 01/06/20 1121    Clinical Impression Statement Continued to provide pt with information and resources for adaptive equipment to allow pt to continue to participate in household and leisure activities safely.  Performed vestibular assessment with pt demonstrating motion sensitivity and + head impulse test bilaterally which appears to be consistent with patient's report of oscillopsia.  Initiated monocular x1 viewing  with eye patch.  Will continue to address and progress towards LTG.    Personal Factors and Comorbidities Comorbidity 2;Finances;Past/Current Experience    Comorbidities anxiety, asthma, CKD, depression, UTI, hernia repair, mouth surgery, neck surgery     Examination-Activity Limitations Locomotion Level;Stairs    Examination-Participation Restrictions Community Activity;Yard Work    Conservation officer, historic buildings Evolving/Moderate complexity    Rehab Potential Fair    PT Frequency 1x / week    PT Duration 6 weeks    PT Treatment/Interventions ADLs/Self Care Home Management;Aquatic Therapy;DME Instruction;Gait training;Stair training;Functional mobility training;Therapeutic activities;Therapeutic exercise;Balance training;Neuromuscular re-education;Patient/family education;Vestibular;Visual/perceptual remediation/compensation    PT Next Visit Plan x1 viewing with one eye patched, balance and coordination, habituation training using compensatory saccades and slower movements    PT Home Exercise Plan balance HEP    Consulted and Agree with Plan of Care Patient           Patient will benefit from skilled therapeutic intervention in order to improve the following deficits and impairments:  Abnormal gait,Decreased balance,Decreased coordination,Difficulty walking,Dizziness,Impaired sensation,Impaired vision/preception  Visit Diagnosis: Unsteadiness on feet  Other lack of coordination  Dizziness and giddiness  Other symptoms and signs involving the nervous system  Ataxic gait     Problem List Patient Active Problem List   Diagnosis Date Noted  . SCA-3 (spinocerebellar ataxia type 3) (HCC) 08/27/2018  . Weight loss 08/27/2018  . UTI (urinary tract infection) 07/26/2014  . Generalized anxiety disorder 06/26/2014  . Ventral hernia 06/26/2014  . Knee pain, left 06/26/2014  . Migraine 06/26/2014  . Syncope 06/26/2014  . History of UTI 06/26/2014  . Anxiety 12/17/2011  . Family hx Ataxia 12/17/2011  . LGA (large for gestational age) fetus 12/17/2011  . Pregnant state, incidental 11/06/2011    Dierdre Highman, PT, DPT 01/06/20    11:30 AM    [facility] [address]  [address] [place], [place], [address] Phone: [phone]   Fax:  [phone]  Name: Morgan Day MRN: [id number] Date of Birth: [birth year]

## 2020-01-10 ENCOUNTER — Other Ambulatory Visit: Payer: Self-pay

## 2020-01-10 ENCOUNTER — Ambulatory Visit: Payer: BC Managed Care – PPO | Admitting: Physical Therapy

## 2020-01-10 DIAGNOSIS — R42 Dizziness and giddiness: Secondary | ICD-10-CM

## 2020-01-10 DIAGNOSIS — R260 Ataxic gait: Secondary | ICD-10-CM | POA: Diagnosis not present

## 2020-01-10 DIAGNOSIS — R2681 Unsteadiness on feet: Secondary | ICD-10-CM

## 2020-01-10 DIAGNOSIS — R29818 Other symptoms and signs involving the nervous system: Secondary | ICD-10-CM

## 2020-01-10 DIAGNOSIS — R278 Other lack of coordination: Secondary | ICD-10-CM

## 2020-01-10 NOTE — Therapy (Signed)
[facility] [address] [place], [place], [address] Phone: [phone]   Fax:  [phone]  Physical Therapy Treatment  Patient Details  Name: Morgan Day MRN: [id number] Date of Birth: 1985-03-25 Referring Provider (PT): Tat, Octaviano Batty, DO   Encounter Date: 01/10/2020   PT End of Session - 01/10/20 1017    Visit Number 5    Number of Visits 7    Date for PT Re-Evaluation 01/20/20    Authorization Type BCBS; VL: PT/OT/ST 60, zero used    Authorization - Visit Number 5    Authorization - Number of Visits 60    Progress Note Due on Visit 10    PT Start Time 0930    PT Stop Time 1015    PT Time Calculation (min) 45 min    Activity Tolerance Patient tolerated treatment well    Behavior During Therapy WFL for tasks assessed/performed           Past Medical History:  Diagnosis Date   Anxiety    DR CYNTHIA WHITE FOR MED   Anxiety    Asthma    exercise asthma   Asthma    Chronic kidney disease AGE 48   REFLUX   Chronic kidney disease    Depression AS  TEEN   Depression    Frequent UTI    Infection    FREQUENT UTI A CHILD   PONV (postoperative nausea and vomiting)    Urinary reflux    as child    Past Surgical History:  Procedure Laterality Date   HERNIA REPAIR     MOUTH SURGERY     NECK SURGERY     THYROGLOSSAL DUCT CYST  05/09/2011   THYROGLOSSAL DUCT CYST  05/09/2011   Procedure: THYROGLOSSAL DUCT CYST;  Surgeon: Osborn Coho, MD;  Location: [facility] OR;  Service: ENT;  Laterality: N/A;   URINARY SURGERY     for reflux   as child    There were no vitals filed for this visit.   Subjective Assessment - 01/10/20 0930    Subjective Doing well but is stressed about hosting Christmas.  Has been performing x1 viewing covering one eye; no dizziness.    Pertinent History anxiety, asthma, CKD, depression, UTI, hernia repair, mouth surgery, neck surgery    Patient Stated Goals To  work on balance and core; to maintain as long as she can    Currently in Pain? No/denies    Pain Onset In the past 7 days                              Vestibular Treatment/Exercise - 01/10/20 0937      Vestibular Treatment/Exercise   Vestibular Treatment Provided Gaze    Gaze Exercises X1 Viewing Horizontal;X1 Viewing Vertical      X1 Viewing Horizontal   Foot Position standing, feet apart, one eye patched.  Performed with L and R eye patched    Reps 4   2 per side   Comments 30 seconds, no symptoms > 60 seconds. no symptoms on R, mild on L.      X1 Viewing Vertical   Foot Position standing, feet apart, one eye patched.  Performed with L and R eye patched    Reps 4   2 per side   Comments 30 > 60 seconds - mild symptoms with vertical  Balance Exercises - 01/10/20 1016      Balance Exercises: Standing   Standing Eyes Opened Narrow base of support (BOS);Head turns;Solid surface;Other reps (comment)   10 reps head turns/nods   Tandem Stance Eyes open;Intermittent upper extremity support;2 reps;20 secs             PT Education - 01/10/20 1017    Education Details updated HEP    Person(s) Educated Patient    Methods Explanation;Demonstration;Handout    Comprehension Verbalized understanding;Returned demonstration            Gaze Stabilization - Tip Card  1.Target must remain in focus, not blurry, and appear stationary while head is in motion. 2.Perform exercises with small head movements (45 to either side of midline). 3.Increase speed of head motion so long as target is in focus. 4.If you wear eyeglasses, be sure you can see target through lens (therapist will give specific instructions for bifocal / progressive lenses). 5.These exercises may provoke dizziness or nausea. Work through these symptoms. If too dizzy, slow head movement slightly. Rest between each exercise. 6.Exercises demand concentration; avoid distractions. 7.For  safety, perform standing exercises close to a counter, wall, corner, or next to someone.  Copyright  VHI. All rights reserved.   Gaze Stabilization - Standing Feet Apart   One eye covered: Feet shoulder width apart, keeping eyes on target on wall 3 feet away, tilt head down slightly and move head side to side for 60 seconds. Repeat while moving head up and down for 60 seconds. Repeat on that side. Switch the patch to the other eye and repeat 2 times side to side, up and down, 60 seconds.  Corner Balance:  Feet Partial Heel-Toe - Eyes Open    With eyes open, right foot forward, left foot back.  Hold balance for 15-20 seconds.   Repeat __2__ times per side; switch feet and repeat. Do __2__ sessions per day.   Feet Together, Head Motion - Eyes Open    With eyes open, feet together, move head slowly: up and down 10 times, then side to side slowly, 10 time. Repeat __2__ times per session. Do __2__ sessions per day.   Heel Raise: Bilateral (Standing)    Rise on balls of feet. Hold for 1-2 seconds, then slowly come back down. Repeat __10__ times per set. Do _1-3___ sets per session. Do __1__ sessions per day. Also, try on one leg-holding counter as needed.   ALSO each leg separately - 10 reps each; keep knee bent  http://orth.exer.us/39   Copyright  VHI. All rights reserved.     Gastroc / Heel Cord Stretch - On Step    Stand with heels over edge of stair. Holding rail, lower heels until stretch is felt in calf of legs. Repeat _2__ times. Do _2__ times per day. Hold for 30-60 secs  Copyright  VHI. All rights reserved.    Heel Cord Stretch    Place one leg forward, bent, other leg behind and straight. Lean forward keeping back heel flat. Hold _30-60___ seconds while counting out loud. Repeat with other leg. Repeat __2__ times. Do __1__ sessions per day.   BACK: Child's Pose    Sit in knee-chest position and reach arms forward. Separate knees for  comfort. Hold position for _15__ breaths. Repeat _2__ times. Do _2__ times per day.    Standing Marching   Using a chair if necessary, march in place. Repeat 10 times. Do 1 sessions per day. Make sure to go slow,  don't let hip drop, and don't let legs touch. Also do marching forward and backward along counter.  http://[email or web address].exer.us/344    Hip Backward Kick   Using a chair for balance, keep legs shoulder width apart and toes pointed for- ward. Slowly extend one leg back, keeping knee straight. Do not lean forward. Repeat with other leg. Repeat 10 times. Do 1 sessions per day.  http://[email or web address].exer.us/340   Copyright  VHI. All rights reserved.     Hip Side Kick   Holding a chair for balance, keep legs shoulder width apart and toes pointed forward. Take half a step back with kicking leg, make sure hips and toes point forward. Swing a leg out to side, keeping knee straight. Do not lean. Repeat using other leg. Repeat 10  times. Do 1 sessions per day.  ALSO DO FORWARD KICKS - 10 reps each leg alternating   Standing On One Leg Without Support .  Stand on one leg in neutral spine without support. Hold 10 seconds. Repeat on other leg. Do 2  repetitions, 1 sets.   AMBULATION: Walk Backward   Walk backward. Take large steps, do not drag feet. 1  reps per set, 1 sets per day, 5 days per week Use assistive device.     Braiding   Move to side: 1) cross right leg in front of left, 2) bring back leg out to side, then 3) cross right leg behind left, 4) bring left leg out to side. Continue sequence in same direction. Reverse sequence, moving in opposite direction. Repeat sequence 3 times per session. Do 1 sessions per day.       Bridge    Lying on back, legs bent 90, feet flat on floor. Tuck in your stomach first-like you're trying to not go to the bathroom, then Press up hips and torso, hold for 2 seconds. Slowly come back down. Perform 3 sets of 10 reps, or 2  sets of 10 reps if hamstring muscles spasm.   Copyright  VHI. All rights reserved.   Upper Extremity Extension (All-Fours)    Tighten stomach and raise left arm parallel to floor. Keep core engaged, slowly lower arm and repeat with other side. Repeat 2 sets of 5 reps. Then perform sliding right foot back, hold, and then return to starting position. Repeat with left side.  Perform 3 days a week.http://orth.exer.us/1117    Perform strengthening exercises 3 days a week. Stretches daily and balance activities daily.      PT Long Term Goals - 01/06/20 1127      PT LONG TERM GOAL #1   Title Pt will demonstrate independence with HEP and will have looked into community wellness options (PT pilates, OT Yoga)  (LTG due 12/31)    Time 6    Period Weeks    Status New      PT LONG TERM GOAL #2   Title Pt will demonstrate 4 point improvement in BERG to indicate decreased falls risk    Baseline 47/56    Time 6    Period Weeks    Status New      PT LONG TERM GOAL #3   Title Pt will demonstrate 4 point improvement in DGI to indicate decreased falls risk in the community    Baseline 10/24    Time 6    Period Weeks    Status New      PT LONG TERM GOAL #4   Title Pt will negotiate 12 stairs x 2 with one  rail, alternating sequence MOD I    Time 6    Period Weeks    Status New      PT LONG TERM GOAL #5   Title With VOR training and use of compensatory training pt will report decreased motion sensitivity on MSQ    Time 6    Period Weeks    Status New    Target Date 01/20/20                 Plan - 01/10/20 1154    Clinical Impression Statement Pt demonstrated progress with x1 viewing today; able to progress to standing and increase time without significant increase in symptoms.  Continued to utilize eye patch for monocular training for VOR and when performing corner balance.  Incorporated head turns into sensory integration training today.  Pt tolerated well.  Will continue  to address in order to progress towards LTG.    Personal Factors and Comorbidities Comorbidity 2;Finances;Past/Current Experience    Comorbidities anxiety, asthma, CKD, depression, UTI, hernia repair, mouth surgery, neck surgery    Examination-Activity Limitations Locomotion Level;Stairs    Examination-Participation Restrictions Community Activity;Yard Work    Conservation officer, historic buildings Evolving/Moderate complexity    Rehab Potential Fair    PT Frequency 1x / week    PT Duration 6 weeks    PT Treatment/Interventions ADLs/Self Care Home Management;Aquatic Therapy;DME Instruction;Gait training;Stair training;Functional mobility training;Therapeutic activities;Therapeutic exercise;Balance training;Neuromuscular re-education;Patient/family education;Vestibular;Visual/perceptual remediation/compensation    PT Next Visit Plan (I will check LTG on 1/4 when I return); continue to progress x1 viewing with one eye patched (patch is above Kris's desk - it goes over the glasses); balance and coordination, habituation training using compensatory saccades and slower movements, resistance training for motor control    PT Home Exercise Plan balance HEP    Consulted and Agree with Plan of Care Patient           Patient will benefit from skilled therapeutic intervention in order to improve the following deficits and impairments:  Abnormal gait,Decreased balance,Decreased coordination,Difficulty walking,Dizziness,Impaired sensation,Impaired vision/preception  Visit Diagnosis: Unsteadiness on feet  Other lack of coordination  Other symptoms and signs involving the nervous system  Ataxic gait  Dizziness and giddiness     Problem List Patient Active Problem List   Diagnosis Date Noted   SCA-3 (spinocerebellar ataxia type 3) (HCC) 08/27/2018   Weight loss 08/27/2018   UTI (urinary tract infection) 07/26/2014   Generalized anxiety disorder 06/26/2014   Ventral hernia 06/26/2014   Knee  pain, left 06/26/2014   Migraine 06/26/2014   Syncope 06/26/2014   History of UTI 06/26/2014   Anxiety 12/17/2011   Family hx Ataxia 12/17/2011   LGA (large for gestational age) fetus 12/17/2011   Pregnant state, incidental 11/06/2011    Dierdre Highman, PT, DPT 01/10/20    11:58 AM    [facility] [address] [place], [place], [address] Phone: [phone]   Fax:  [phone]  Name: Mahogany Torrance Preziosi MRN: [id number] Date of Birth: February 22, 1985

## 2020-01-10 NOTE — Patient Instructions (Signed)
Gaze Stabilization - Tip Card  1.Target must remain in focus, not blurry, and appear stationary while head is in motion. 2.Perform exercises with small head movements (45 to either side of midline). 3.Increase speed of head motion so long as target is in focus. 4.If you wear eyeglasses, be sure you can see target through lens (therapist will give specific instructions for bifocal / progressive lenses). 5.These exercises may provoke dizziness or nausea. Work through these symptoms. If too dizzy, slow head movement slightly. Rest between each exercise. 6.Exercises demand concentration; avoid distractions. 7.For safety, perform standing exercises close to a counter, wall, corner, or next to someone.  Copyright  VHI. All rights reserved.   Gaze Stabilization - Standing Feet Apart   One eye covered: Feet shoulder width apart, keeping eyes on target on wall 3 feet away, tilt head down slightly and move head side to side for 60 seconds. Repeat while moving head up and down for 60 seconds. Repeat on that side. Switch the patch to the other eye and repeat 2 times side to side, up and down, 60 seconds.  Corner Balance:  Feet Partial Heel-Toe - Eyes Open    With eyes open, right foot forward, left foot back.  Hold balance for 15-20 seconds.   Repeat __2__ times per side; switch feet and repeat. Do __2__ sessions per day.   Feet Together, Head Motion - Eyes Open    With eyes open, feet together, move head slowly: up and down 10 times, then side to side slowly, 10 time. Repeat __2__ times per session. Do __2__ sessions per day.   Heel Raise: Bilateral (Standing)    Rise on balls of feet. Hold for 1-2 seconds, then slowly come back down. Repeat __10__ times per set. Do _1-3___ sets per session. Do __1__ sessions per day. Also, try on one leg-holding counter as needed.   ALSO each leg separately - 10 reps each; keep knee bent  http://orth.exer.us/39   Copyright  VHI. All  rights reserved.     Gastroc / Heel Cord Stretch - On Step    Stand with heels over edge of stair. Holding rail, lower heels until stretch is felt in calf of legs. Repeat _2__ times. Do _2__ times per day. Hold for 30-60 secs  Copyright  VHI. All rights reserved.    Heel Cord Stretch    Place one leg forward, bent, other leg behind and straight. Lean forward keeping back heel flat. Hold _30-60___ seconds while counting out loud. Repeat with other leg. Repeat __2__ times. Do __1__ sessions per day.   BACK: Child's Pose    Sit in knee-chest position and reach arms forward. Separate knees for comfort. Hold position for _15__ breaths. Repeat _2__ times. Do _2__ times per day.    Standing Marching   Using a chair if necessary, march in place. Repeat 10 times. Do 1 sessions per day. Make sure to go slow, don't let hip drop, and don't let legs touch. Also do marching forward and backward along counter.  http://[email or web address].exer.us/344    Hip Backward Kick   Using a chair for balance, keep legs shoulder width apart and toes pointed for- ward. Slowly extend one leg back, keeping knee straight. Do not lean forward. Repeat with other leg. Repeat 10 times. Do 1 sessions per day.  http://[email or web address].exer.us/340   Copyright  VHI. All rights reserved.     Hip Side Kick   Holding a chair for balance, keep legs shoulder width apart and toes pointed  forward. Take half a step back with kicking leg, make sure hips and toes point forward. Swing a leg out to side, keeping knee straight. Do not lean. Repeat using other leg. Repeat 10  times. Do 1 sessions per day.  ALSO DO FORWARD KICKS - 10 reps each leg alternating   Standing On One Leg Without Support .  Stand on one leg in neutral spine without support. Hold 10 seconds. Repeat on other leg. Do 2  repetitions, 1 sets.   AMBULATION: Walk Backward   Walk backward. Take large steps, do not drag feet. 1  reps per  set, 1 sets per day, 5 days per week Use assistive device.     Braiding   Move to side: 1) cross right leg in front of left, 2) bring back leg out to side, then 3) cross right leg behind left, 4) bring left leg out to side. Continue sequence in same direction. Reverse sequence, moving in opposite direction. Repeat sequence 3 times per session. Do 1 sessions per day.       Bridge    Lying on back, legs bent 90, feet flat on floor. Tuck in your stomach first-like you're trying to not go to the bathroom, then Press up hips and torso, hold for 2 seconds. Slowly come back down. Perform 3 sets of 10 reps, or 2 sets of 10 reps if hamstring muscles spasm.   Copyright  VHI. All rights reserved.   Upper Extremity Extension (All-Fours)    Tighten stomach and raise left arm parallel to floor. Keep core engaged, slowly lower arm and repeat with other side. Repeat 2 sets of 5 reps. Then perform sliding right foot back, hold, and then return to starting position. Repeat with left side.  Perform 3 days a week.http://orth.exer.us/1117    Perform strengthening exercises 3 days a week. Stretches daily and balance activities daily.

## 2020-01-19 ENCOUNTER — Ambulatory Visit: Payer: BC Managed Care – PPO | Admitting: Physical Therapy

## 2020-01-24 ENCOUNTER — Ambulatory Visit: Payer: BC Managed Care – PPO | Admitting: Physical Therapy

## 2020-03-12 NOTE — Telephone Encounter (Signed)
Encounter opened in error

## 2020-03-26 ENCOUNTER — Telehealth: Payer: Self-pay | Admitting: Neurology

## 2020-03-26 MED ORDER — UNABLE TO FIND: 1 refills | Status: AC

## 2020-03-26 NOTE — Telephone Encounter (Signed)
Spoke with [facility] pharmacist and gave her verbal orders to refill patients medication. She voiced understanding.    Patient notified and voiced understanding.

## 2020-03-26 NOTE — Telephone Encounter (Signed)
Rhae Hammock, can you help heather fill this one?

## 2020-03-26 NOTE — Telephone Encounter (Signed)
Patient states that her RX Aminopyridine was denied and she is not sure why. She has appt with Tat on 11-28-20   [facility] on [address]   Please call patient

## 2020-05-16 DIAGNOSIS — Z124 Encounter for screening for malignant neoplasm of cervix: Secondary | ICD-10-CM | POA: Diagnosis not present

## 2020-05-16 DIAGNOSIS — I10 Essential (primary) hypertension: Secondary | ICD-10-CM | POA: Diagnosis not present

## 2020-05-16 DIAGNOSIS — L989 Disorder of the skin and subcutaneous tissue, unspecified: Secondary | ICD-10-CM | POA: Diagnosis not present

## 2020-05-16 DIAGNOSIS — N3000 Acute cystitis without hematuria: Secondary | ICD-10-CM | POA: Diagnosis not present

## 2020-05-16 DIAGNOSIS — Z01411 Encounter for gynecological examination (general) (routine) with abnormal findings: Secondary | ICD-10-CM | POA: Diagnosis not present

## 2020-05-16 DIAGNOSIS — N390 Urinary tract infection, site not specified: Secondary | ICD-10-CM | POA: Diagnosis not present

## 2020-05-16 DIAGNOSIS — R634 Abnormal weight loss: Secondary | ICD-10-CM | POA: Diagnosis not present

## 2020-08-01 DIAGNOSIS — I10 Essential (primary) hypertension: Secondary | ICD-10-CM

## 2020-08-01 DIAGNOSIS — Z681 Body mass index (BMI) 19 or less, adult: Secondary | ICD-10-CM | POA: Diagnosis not present

## 2020-08-01 DIAGNOSIS — Z13228 Encounter for screening for other metabolic disorders: Secondary | ICD-10-CM | POA: Diagnosis not present

## 2020-08-01 DIAGNOSIS — G118 Other hereditary ataxias: Secondary | ICD-10-CM | POA: Diagnosis not present

## 2020-08-01 DIAGNOSIS — R634 Abnormal weight loss: Secondary | ICD-10-CM | POA: Diagnosis not present

## 2020-08-01 DIAGNOSIS — Z0000 Encounter for general adult medical examination without abnormal findings: Secondary | ICD-10-CM | POA: Diagnosis not present

## 2020-08-01 HISTORY — DX: Essential (primary) hypertension: I10

## 2020-09-03 DIAGNOSIS — M533 Sacrococcygeal disorders, not elsewhere classified: Secondary | ICD-10-CM | POA: Diagnosis not present

## 2020-09-03 DIAGNOSIS — R636 Underweight: Secondary | ICD-10-CM | POA: Diagnosis not present

## 2020-09-03 DIAGNOSIS — F411 Generalized anxiety disorder: Secondary | ICD-10-CM | POA: Diagnosis not present

## 2020-09-03 DIAGNOSIS — F410 Panic disorder [episodic paroxysmal anxiety] without agoraphobia: Secondary | ICD-10-CM | POA: Diagnosis not present

## 2020-09-07 ENCOUNTER — Encounter: Payer: Self-pay | Admitting: Medical

## 2020-10-02 ENCOUNTER — Other Ambulatory Visit: Payer: Self-pay | Admitting: Neurology

## 2020-10-02 DIAGNOSIS — G118 Other hereditary ataxias: Secondary | ICD-10-CM

## 2020-10-02 DIAGNOSIS — R1319 Other dysphagia: Secondary | ICD-10-CM

## 2020-10-05 ENCOUNTER — Other Ambulatory Visit: Payer: Self-pay

## 2020-10-09 ENCOUNTER — Telehealth: Payer: Self-pay | Admitting: Neurology

## 2020-10-09 NOTE — Telephone Encounter (Signed)
4-aminopyradine, patient needs refills as soon as possible. She is worried about being without the medication.  [facility]

## 2020-10-09 NOTE — Telephone Encounter (Signed)
Prescription was called in this morning to [facility] for patient

## 2020-11-08 DIAGNOSIS — L573 Poikiloderma of Civatte: Secondary | ICD-10-CM | POA: Diagnosis not present

## 2020-11-08 DIAGNOSIS — L821 Other seborrheic keratosis: Secondary | ICD-10-CM | POA: Diagnosis not present

## 2020-11-08 DIAGNOSIS — Z808 Family history of malignant neoplasm of other organs or systems: Secondary | ICD-10-CM | POA: Diagnosis not present

## 2020-11-08 DIAGNOSIS — D229 Melanocytic nevi, unspecified: Secondary | ICD-10-CM | POA: Diagnosis not present

## 2020-11-26 NOTE — Progress Notes (Signed)
Assessment/Plan:   1.  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  Understands the concept of anticipation.  -Patient is on off label 4-aminopyridine, by her request.  Understands the risk of seizure with this medication.  -handicap placard filled out today.  -pt applied to be in study in [place].  Awaiting input to see if she will be accepted   2.  Diplopia  -needs to f/u with optometry.  In theory, she needs ophthalmology.  Discussed that again today.  She asks for referral and we will do that.  3.  Intermittent dysphagia  -MBE done is 2021 was good  4.  M. Spasm  -continue baclofen to 10 mg tid  5. F/u 1 year Subjective:   Morgan Day was seen today in follow up for spinocerebellar ataxia type III.  My previous records as well as any outside records available were reviewed prior to todays visit.  Pt is currently on 4-aminopyridine, 5 mg every 12 hours.    She has had no falls since last visit but has had some near falls.  She has been considering enrolling in a clinical trial.  She is on the waiting list still.  It is out of [place].  She has been to dermatology since our last visit.  She was seen on October 20.  I have reviewed the records.  She was seen by the nurse practitioner and was told nothing concerning on her examination.  She is still on baclofen 10 mg tid.  She states that "it is awesome."   Takes it bid some days and tid other days.  No falls.    CURRENT MEDICATIONS:  Outpatient Encounter Medications as of 11/28/2020  Medication Sig   ALPRAZolam (XANAX) 0.5 MG tablet 1/2-1 tab po q day as needed anxiety   baclofen (LIORESAL) 10 MG tablet TAKE 1 TABLET BY MOUTH THREE TIMES A DAY   Biotin 10 MG TABS Take by mouth 2 (two) times daily.   Calcium Carb-Cholecalciferol (CALCIUM 500 + D PO) Take 1 tablet by mouth in the morning and at bedtime.   co-enzyme Q-10 30 MG capsule Take 30 mg by mouth 3 (three) times daily.    hydrochlorothiazide (MICROZIDE) 12.5 MG capsule hydrochlorothiazide 12.5 mg capsule   meloxicam (MOBIC) 15 MG tablet Take 15 mg by mouth daily.   Multiple Vitamins-Minerals (MULTIVITAMIN WITH MINERALS) tablet Take 1 tablet by mouth daily.   nitrofurantoin, macrocrystal-monohydrate, (MACROBID) 100 MG capsule 1 tab po q day after sex.   NON FORMULARY 2 mushroom supplements twice a day   SRONYX 0.1-20 MG-MCG tablet Take 1 tablet by mouth daily.   SUMAtriptan (IMITREX) 100 MG tablet Take 1 tablet (100 mg total) by mouth every 2 (two) hours as needed for migraine. May repeat in 2 hours if headache persists or recurs.   UNABLE TO FIND CBD OIL   UNABLE TO FIND 4-aminopyradine,Take 1 tablet 5 mg q 12 hours (write this instead of bid).   [DISCONTINUED] albuterol (PROVENTIL HFA;VENTOLIN HFA) 108 (90 Base) MCG/ACT inhaler Inhale 2 puffs into the lungs every 6 (six) hours as needed for wheezing or shortness of breath.   [DISCONTINUED] cholecalciferol (VITAMIN D) 1000 UNITS tablet Take 1,000 Units by mouth daily.   [DISCONTINUED] NON FORMULARY Mega benfotiamine 250mg  take 1 tablet once a day   [DISCONTINUED] vitamin C (ASCORBIC ACID) 500 MG tablet Take 500 mg by mouth daily.   No facility-administered encounter medications on file as of 11/28/2020.  Objective:   PHYSICAL EXAMINATION:    VITALS:   Vitals:   11/28/20 1123  BP: 108/64  Pulse: 100  SpO2: 100%  Weight: 111 lb 12.8 oz (50.7 kg)  Height: 5\' 7"  (1.702 m)    HEENT:  [place]/AT CV:  Tachy.  Regular Lungs:  CTAB Neck:  No bruits  Neuro:  Orientation:  The patient is alert and oriented x 3.   Cranial nerves: There is good facial symmetry.  No nystagmus is noted.  No square wave jerks.  She has some overshoot dysmetria.  Speech is fluent and clear but has intermittent pseudobulbar characteristics. Soft palate rises symmetrically and there is no tongue deviation. Hearing is intact to conversational tone. Tone: Tone is good  throughout. Sensation: Sensation is intact to light touch x 4 Coordination:  The patient has no difficulty with RAM's or FNF bilaterally. Motor: Strength is 5/5 in the bilateral upper and lower extremities.   Gait and Station: The patient is able to ambulate without difficulty. The patient has mild trouble ambulating in a tandem fashion. The patient is able to stand in the Romberg position.     Chemistry      Component Value Date/Time   NA 137 08/19/2014 0840   K 3.6 08/19/2014 0840   CL 102 08/19/2014 0840   CO2 27 08/19/2014 0840   BUN 7 08/19/2014 0840   CREATININE 0.64 08/19/2014 0840      Component Value Date/Time   CALCIUM 9.1 08/19/2014 0840   ALKPHOS 59 08/19/2014 0840   AST 19 08/19/2014 0840   ALT 16 08/19/2014 0840   BILITOT 1.0 08/19/2014 0840       Total time spent on today's visit was 20 minutes, including both face-to-face time and nonface-to-face time.  Time included that spent on review of records (prior notes available to me/labs/imaging if pertinent), discussing treatment and goals, answering patient's questions and coordinating care.  Cc:  08/21/2014, PA-C

## 2020-11-28 ENCOUNTER — Encounter: Payer: Self-pay | Admitting: Neurology

## 2020-11-28 ENCOUNTER — Ambulatory Visit: Payer: BC Managed Care – PPO | Admitting: Neurology

## 2020-11-28 ENCOUNTER — Other Ambulatory Visit: Payer: Self-pay

## 2020-11-28 VITALS — BP 108/64 | HR 100 | Ht 67.0 in | Wt 111.8 lb

## 2020-11-28 DIAGNOSIS — R1319 Other dysphagia: Secondary | ICD-10-CM

## 2020-11-28 DIAGNOSIS — G118 Other hereditary ataxias: Secondary | ICD-10-CM

## 2020-12-31 ENCOUNTER — Telehealth (INDEPENDENT_AMBULATORY_CARE_PROVIDER_SITE_OTHER): Payer: BC Managed Care – PPO | Admitting: Medical

## 2020-12-31 DIAGNOSIS — R300 Dysuria: Secondary | ICD-10-CM

## 2020-12-31 DIAGNOSIS — F419 Anxiety disorder, unspecified: Secondary | ICD-10-CM | POA: Diagnosis not present

## 2020-12-31 DIAGNOSIS — N390 Urinary tract infection, site not specified: Secondary | ICD-10-CM

## 2020-12-31 MED ORDER — NITROFURANTOIN MONOHYD MACRO 100 MG PO CAPS: 100.0000 mg | ORAL_CAPSULE | Freq: Two times a day (BID) | ORAL | 0 refills | Status: DC

## 2020-12-31 MED ORDER — ALPRAZOLAM 0.5 MG PO TABS: ORAL_TABLET | ORAL | 0 refills | Status: DC

## 2020-12-31 NOTE — Addendum Note (Signed)
Addended by: Thelma Barge D on: 12/31/2020 01:16 PM   Modules accepted: Orders

## 2020-12-31 NOTE — Progress Notes (Signed)
   Subjective:    Patient ID: Morgan Day, female    DOB: 1985/04/05, 35 y.o.   MRN: [id number]  HPI Virtual Visit via Video Note  I connected with Morgan Day on 12/31/20 at 11:20 AM EST by a video enabled telemedicine application and verified that I am speaking with the correct person using two identifiers.  Location: Patient: home Provider: home   I discussed the limitations of evaluation and management by telemedicine and the availability of in person appointments. The patient expressed understanding and agreed to proceed.  History of Present Illness:   Pt in today reporting urinary symptoms for one week.  Dysuria- yes Frequent urination-yes Hesitancy-no Suprapubic pressure-yes Fever-no chills-no  Nausea-no Vomiting-no CVA pain-no History of UTI-yes Gross hematuria- no  Pt in the past had chronic recurrent uti. She was using 1 tab after sex and that prevented recurrence.   Lmp- one week on ocp.  Hx of anxiety. Pt takes tabs rarely. Pt uses about 3 tab of xanax a [place]. Pt neurologist rx hydroxyzine but did not help.   Observations/Objective:  General-no acute distress, pleasant, oriented. Lungs- on inspection lungs appear unlabored. Neck- no tracheal deviation or jvd on inspection. Neuro- gross motor function appears intact.   Assessment and Plan:  Patient Instructions  Your appear to have a urinary tract infection. I am prescribing  macrobid antibiotic for the probable infection. Hydrate well. I am sending out a urine culture. During the interim if your signs and symptoms worsen rather than improving please notify us. We will notify your when the culture results are back.  Arrange for pt to give UA and culture tomorrow morning. She can't come in today due to her schedule.   For anxiety rx limited   Follow up in 7 days or as needed.    Follow Up Instructions:    I discussed the assessment and treatment plan with the patient. The  patient was provided an opportunity to ask questions and all were answered. The patient agreed with the plan and demonstrated an understanding of the instructions.   The patient was advised to call back or seek an in-person evaluation if the symptoms worsen or if the condition fails to improve as anticipated.  Time spent with patient today was  30 minutes which consisted of chart revdiew, discussing diagnosis, work up treatment and documentation.    Esperanza Richters, PA-C    Review of Systems  Constitutional:  Negative for chills, fatigue and fever.  Respiratory:  Negative for cough, choking, shortness of breath and wheezing.   Cardiovascular:  Negative for chest pain and palpitations.  Gastrointestinal:  Negative for abdominal pain.  Genitourinary:  Positive for dysuria, frequency and urgency. Negative for flank pain and hematuria.  Musculoskeletal:  Negative for back pain and joint swelling.  Psychiatric/Behavioral:  The patient is nervous/anxious.       Objective:   Physical Exam        Assessment & Plan:

## 2020-12-31 NOTE — Patient Instructions (Signed)
Your appear to have a urinary tract infection. I am prescribing  macrobid antibiotic for the probable infection. Hydrate well. I am sending out a urine culture. During the interim if your signs and symptoms worsen rather than improving please notify us. We will notify your when the culture results are back.  Arrange for pt to give UA and culture tomorrow morning. She can't come in today due to her schedule.   For anxiety rx limited   Follow up in 7 days or as needed.

## 2021-01-01 ENCOUNTER — Other Ambulatory Visit (INDEPENDENT_AMBULATORY_CARE_PROVIDER_SITE_OTHER): Payer: BC Managed Care – PPO

## 2021-01-01 DIAGNOSIS — N390 Urinary tract infection, site not specified: Secondary | ICD-10-CM | POA: Diagnosis not present

## 2021-01-01 DIAGNOSIS — R300 Dysuria: Secondary | ICD-10-CM | POA: Diagnosis not present

## 2021-01-01 LAB — POC URINALSYSI DIPSTICK (AUTOMATED)
Bilirubin, UA: NEGATIVE
Blood, UA: NEGATIVE
Glucose, UA: NEGATIVE
Ketones, UA: NEGATIVE
Nitrite, UA: NEGATIVE
Protein, UA: POSITIVE — AB
Spec Grav, UA: 1.02 (ref 1.010–1.025)
Urobilinogen, UA: 0.2 E.U./dL
pH, UA: 7 (ref 5.0–8.0)

## 2021-01-02 LAB — URINE CULTURE
MICRO NUMBER:: 12750361
Result:: NO GROWTH
SPECIMEN QUALITY:: ADEQUATE

## 2021-01-03 ENCOUNTER — Encounter: Payer: Self-pay | Admitting: Medical

## 2021-01-04 ENCOUNTER — Ambulatory Visit: Payer: BC Managed Care – PPO | Admitting: Medical

## 2021-01-23 ENCOUNTER — Telehealth: Payer: Self-pay | Admitting: Neurology

## 2021-01-23 ENCOUNTER — Other Ambulatory Visit: Payer: Self-pay

## 2021-01-23 NOTE — Telephone Encounter (Signed)
1. Which medications need refilled? (List name and dosage, if known) aminopyradine  2. Which pharmacy/location is medication to be sent to? (include street and city if local pharmacy) [facility]  3. Do they need a 30 day or 90 day supply? [place]

## 2021-01-23 NOTE — Telephone Encounter (Signed)
Called [facility] and did a verbal refill for this patient. Called patient and left voicemail that this request has been taking care of

## 2021-02-21 ENCOUNTER — Telehealth (INDEPENDENT_AMBULATORY_CARE_PROVIDER_SITE_OTHER): Payer: BC Managed Care – PPO | Admitting: Family Medicine

## 2021-02-21 ENCOUNTER — Encounter: Payer: Self-pay | Admitting: Family Medicine

## 2021-02-21 ENCOUNTER — Telehealth: Payer: Self-pay

## 2021-02-21 VITALS — Temp 99.6°F

## 2021-02-21 DIAGNOSIS — U071 COVID-19: Secondary | ICD-10-CM | POA: Diagnosis not present

## 2021-02-21 MED ORDER — BENZONATATE 100 MG PO CAPS: ORAL_CAPSULE | ORAL | 0 refills | Status: DC

## 2021-02-21 MED ORDER — MOLNUPIRAVIR EUA 200MG CAPSULE: 4.0000 | ORAL_CAPSULE | Freq: Two times a day (BID) | ORAL | 0 refills | Status: AC

## 2021-02-21 NOTE — Telephone Encounter (Signed)
Pt scheduled.   [facility] - Client Client Site [place] [facility] - Night Provider [place], Ramon [place] - [place] Contact Type Call Who Is Calling Patient / Member / Family / Caregiver Caller Name Trea Carnegie Caller Phone Number [phone] Patient Name Morgan Day Patient DOB 1985/01/28 Call Type Message Only Information Provided Reason for Call Request to Schedule Office Appointment Initial Comment Caller states she would like to schedule an appt. Patient request to speak to RN No Additional Comment Office hours provided. Disp. Time Disposition Final User 02/21/2021 8:04:30 AM General Information Provided Yes Renne Musca Call Closed By: Renne Musca Transaction Date/Time: 02/21/2021 8:01:29 AM (ET

## 2021-02-21 NOTE — Patient Instructions (Addendum)
HOME CARE TIPS:    -I sent the medication(s) we discussed to your pharmacy: Meds ordered this encounter  Medications   molnupiravir EUA (LAGEVRIO) 200 mg CAPS capsule    Sig: Take 4 capsules (800 mg total) by mouth 2 (two) times daily for 5 days.    Dispense:  40 capsule    Refill:  0   benzonatate (TESSALON PERLES) 100 MG capsule    Sig: 1-2 capsules up to twice daily as needed for cough    Dispense:  30 capsule    Refill:  0     -I sent in the [place] treatment or referral you requested per our discussion. Please see the information provided below and discuss further with the pharmacist/treatment team.   -there is a chance of rebound illness after finishing your treatment. If you become sick again please isolate for an additional 5 days, plus 5 more days of masking.   -can use tylenol if needed for fevers, aches and pains per instructions  -can use nasal saline a few times per day if you have nasal congestion  -stay hydrated, drink plenty of fluids and eat small healthy meals - avoid dairy  -can take 1000 IU (12mg) Vit D3 and 100-500 mg of Vit C daily per instructions  -If the Covid test is positive, check out the [facility] for more information on home care, transmission and treatment for COVID19  -follow up with your doctor in 2-3 days unless improving and feeling better  -stay home while sick, except to seek medical care. If you have COVID19, you will likely be contagious for 7-10 days. Flu or Influenza is likely contagious for about 7 days. Other respiratory viral infections remain contagious for 5-10+ days depending on the virus and many other factors. Wear a good mask that fits snugly (such as N95 or KN95) if around others to reduce the risk of transmission.  It was nice to meet you today, and I really hope you are feeling better soon. I help  out with telemedicine visits on Tuesdays and Thursdays and am happy to help if you need a follow up virtual visit on  those days. Otherwise, if you have any concerns or questions following this visit please schedule a follow up visit with your Primary Care doctor or seek care at a local urgent care clinic to avoid delays in care.    Seek in person care or schedule a follow up video visit promptly if your symptoms worsen, new concerns arise or you are not improving with treatment. Call 911 and/or seek emergency care if your symptoms are severe or life threatening.     Fact Sheet for Patients And Caregivers Emergency Use Authorization (EUA) Of LAGEVRIO (molnupiravir) capsules For Coronavirus Disease 2019 (COVID-19)  What is the most important information I should know about LAGEVRIO? LAGEVRIO may cause serious side effects, including: ? LAGEVRIO may cause harm to your unborn baby. It is not known if LAGEVRIO will harm your baby if you take LAGEVRIO during pregnancy. o LAGEVRIO is not recommended for use in pregnancy. o LAGEVRIO has not been studied in pregnancy. LAGEVRIO was studied in pregnant animals only. When LAGEVRIO was given to pregnant animals, LAGEVRIO caused harm to their unborn babies. o You and your healthcare provider may decide that you should take LAGEVRIO during pregnancy if there are no other COVID-19 treatment options approved or authorized by the FDA that are accessible or clinically appropriate for you. o If you and your healthcare provider  decide that you should take LAGEVRIO during pregnancy, you and your healthcare provider should discuss the known and potential benefits and the potential risks of taking LAGEVRIO during pregnancy. For individuals who are able to become pregnant: ? You should use a reliable method of birth control (contraception) consistently and correctly during treatment with LAGEVRIO and for 4 days after the last dose of LAGEVRIO. Talk to your healthcare provider about reliable birth control methods. ? Before starting treatment with [facility] your healthcare  provider may do a pregnancy test to see if you are pregnant before starting treatment with LAGEVRIO. ? Tell your healthcare provider right away if you become pregnant or think you may be pregnant during treatment with LAGEVRIO. Pregnancy Surveillance Program: ? There is a pregnancy surveillance program for individuals who take LAGEVRIO during pregnancy. The purpose of this program is to collect information about the health of you and your baby. Talk to your healthcare provider about how to take part in this program. ? If you take LAGEVRIO during pregnancy and you agree to participate in the pregnancy surveillance program and allow your healthcare provider to share your information with [place], then your healthcare provider will report your use of [place] during pregnancy to [place]. by calling [phone] or [email or web address]. For individuals who are sexually active with partners who are able to become pregnant: ? It is not known if LAGEVRIO can affect sperm. While the risk is regarded as low, animal studies to fully assess the potential for LAGEVRIO to affect the babies of males treated with LAGEVRIO have not been completed. A reliable method of birth control (contraception) should be used consistently and correctly during treatment with LAGEVRIO and for at least 3 months after the last dose. The risk to sperm beyond 3 months is not known. Studies to understand the risk to sperm beyond 3 months are ongoing. Talk to your healthcare provider about reliable birth control methods. Talk to your healthcare provider if you have questions or concerns about how LAGEVRIO may affect sperm. You are being given this fact sheet because your healthcare provider believes it is necessary to provide you with LAGEVRIO for the treatment of adults with mild-to-moderate coronavirus disease 2019 (COVID-19) with positive results of direct SARS-CoV-2 viral testing, and who  are at high risk for progression to severe COVID-19 including hospitalization or death, and for whom other COVID-19 treatment options approved or authorized by the FDA are not accessible or clinically appropriate. The U.S. Food and Drug Administration (FDA) has issued an Emergency Use Authorization (EUA) to make LAGEVRIO available during the COVID-19 pandemic (for more details about an EUA please see What is an Emergency Use Authorization? at the end of this document). LAGEVRIO is not an FDA-approved medicine in the [place]. Read this Fact Sheet for information about LAGEVRIO. Talk to your healthcare provider about your options if you have any questions. It is your choice to take LAGEVRIO.  What is COVID-19? COVID-19 is caused by a virus called a coronavirus. You can get COVID-19 through close contact with another person who has the virus. COVID-19 illnesses have ranged from very mild-to-severe, including illness resulting in death. While information so far suggests that most COVID-19 illness is mild, serious illness can happen and may cause some of your other medical conditions to become worse. Older people and people of all ages with severe, long lasting (chronic) medical conditions like heart disease, lung disease and diabetes, for example seem to be at higher  risk of being hospitalized for COVID-19.  What is LAGEVRIO? LAGEVRIO is an investigational medicine used to treat mild-to-moderate COVID-19 in adults: ? with positive results of direct SARS-CoV-2 viral testing, and ? who are at high risk for progression to severe COVID-19 including hospitalization or death, and for whom other COVID-19 treatment options approved or authorized by the FDA are not accessible or clinically appropriate. The FDA has authorized the emergency use of LAGEVRIO for the treatment of mild-tomoderate COVID-19 in adults under an EUA. For more information on EUA, see the What is an Emergency Use  Authorization (EUA)? section at the end of this Fact Sheet. LAGEVRIO is not authorized: ? for use in people less than 88 years of age. ? for prevention of COVID-19. ? for people needing hospitalization for COVID-19. ? for use for longer than 5 consecutive days.  What should I tell my healthcare provider before I take LAGEVRIO? Tell your healthcare provider if you: ? Have any allergies ? Are breastfeeding or plan to breastfeed ? Have any serious illnesses ? Are taking any medicines (prescription, over-the-counter, vitamins, or herbal products).  How do I take LAGEVRIO? ? Take LAGEVRIO exactly as your healthcare provider tells you to take it. ? Take 4 capsules of LAGEVRIO every 12 hours (for example, at 8 am and at 8 pm) ? Take LAGEVRIO for 5 days. It is important that you complete the full 5 days of treatment with LAGEVRIO. Do not stop taking LAGEVRIO before you complete the full 5 days of treatment, even if you feel better. ? Take LAGEVRIO with or without food. ? You should stay in isolation for as long as your healthcare provider tells you to. Talk to your healthcare provider if you are not sure about how to properly isolate while you have COVID-19. ? Swallow LAGEVRIO capsules whole. Do not open, break, or crush the capsules. If you cannot swallow capsules whole, tell your healthcare provider. ? What to do if you miss a dose: o If it has been less than 10 hours since the missed dose, take it as soon as you remember o If it has been more than 10 hours since the missed dose, skip the missed dose and take your dose at the next scheduled time. ? Do not double the dose of LAGEVRIO to make up for a missed dose.  What are the important possible side effects of LAGEVRIO? ? See, What is the most important information I should know about LAGEVRIO? ? Allergic Reactions. Allergic reactions can happen in people taking LAGEVRIO, even after only 1 dose. Stop taking LAGEVRIO and call your  healthcare provider right away if you get any of the following symptoms of an allergic reaction: o hives o rapid heartbeat o trouble swallowing or breathing o swelling of the mouth, lips, or face o throat tightness o hoarseness o skin rash The most common side effects of LAGEVRIO are: ? diarrhea ? nausea ? dizziness These are not all the possible side effects of LAGEVRIO. Not many people have taken LAGEVRIO. Serious and unexpected side effects may happen. This medicine is still being studied, so it is possible that all of the risks are not known at this time.  What other treatment choices are there?  Veklury (remdesivir) is FDA-approved as an intravenous (IV) infusion for the treatment of mildto-moderate PQZRA-07 in certain adults and children. Talk with your doctor to see if Marijean Heath is appropriate for you. Like LAGEVRIO, FDA may also allow for the emergency use of other medicines  to treat people with COVID-19. Go to LacrosseProperties.si for more information. It is your choice to be treated or not to be treated with LAGEVRIO. Should you decide not to take it, it will not change your standard medical care.  What if I am breastfeeding? Breastfeeding is not recommended during treatment with LAGEVRIO and for 4 days after the last dose of LAGEVRIO. If you are breastfeeding or plan to breastfeed, talk to your healthcare provider about your options and specific situation before taking LAGEVRIO.  How do I report side effects with LAGEVRIO? Contact your healthcare provider if you have any side effects that bother you or do not go away. Report side effects to FDA MedWatch at SmoothHits.hu or call 1-800-FDA-1088 (1- 530-114-4510).  How should I store [place]? ? Store LAGEVRIO capsules at room temperature between 59F to 23F (20C to 25C). ? Keep LAGEVRIO and all medicines out of  the reach of children and pets. How can I learn more about COVID-19? ? Ask your healthcare provider. ? Visit SeekRooms.co.uk ? Contact your local or state public health department. ? Call [place] at [phone] (toll free in the [place] ? Visit [email or web address].com  What Is an Emergency Use Authorization (EUA)? The [place] FDA has made [place] available under an emergency access mechanism called an Emergency Use Authorization (EUA) The EUA is supported by a Presenter, broadcasting Health and Human Service (HHS) declaration that circumstances exist to justify emergency use of drugs and biological products during the COVID-19 pandemic. LAGEVRIO for the treatment of mild-to-moderate COVID-19 in adults with positive results of direct SARS-CoV-2 viral testing, who are at high risk for progression to severe COVID-19, including hospitalization or death, and for whom alternative COVID-19 treatment options approved or authorized by FDA are not accessible or clinically appropriate, has not undergone the same type of review as an FDA-approved product. In issuing an EUA under the GNOIB-70 public health emergency, the FDA has determined, among other things, that based on the total amount of scientific evidence available including data from adequate and well-controlled clinical trials, if available, it is reasonable to believe that the product may be effective for diagnosing, treating, or preventing COVID-19, or a serious or life-threatening disease or condition caused by COVID-19; that the known and potential benefits of the product, when used to diagnose, treat, or prevent such disease or condition, outweigh the known and potential risks of such product; and that there are no adequate, approved, and available alternatives.  All of these criteria must be met to allow for the product to be used in the treatment of patients during the COVID-19 pandemic. The EUA for LAGEVRIO is in effect for  the duration of the COVID-19 declaration justifying emergency use of LAGEVRIO, unless terminated or revoked (after which LAGEVRIO may no longer be used under the EUA). For patent information: http://rogers.info/ Copyright  2021-2022 [place] [place], [place] and its affiliates. All rights reserved. [id number] Revised: March 2022

## 2021-02-21 NOTE — Progress Notes (Signed)
Virtual Visit via Telephone Note  I connected with Morgan Day on 02/21/21 at 12:20 PM EST by telephone and verified that I am speaking with the correct person using two identifiers.   I discussed the limitations of performing an evaluation and management service by telephone and requested permission for a phone visit. The patient expressed understanding and agreed to proceed.  Location patient:  [place] Location provider: work or home office Participants present for the call: patient, provider Patient did not have a visit with me in the prior 7 days to address this/these issue(s).   History of Present Illness:  Acute telemedicine visit for Covid19: -Onset: 4 days ago; positive covid test today -Symptoms include: nasal congestion, cough, fever, hr up when febrile on her iwatch - she thinks this is from anxiety too, HR is 110 today, body aches -she was around her father in law who was in the hospital for covid -Denies:CP, SOB, NVD -drinking fluids, able to get up and down out of bed -Pertinent past medical history: see below, think had covid once in the past -Pertinent medication allergies:  Allergies  Allergen Reactions   Other     Narcotic pain medicine she reports makes her nauseated  -COVID-19 vaccine status: has not had any vaccines Immunization History  Administered Date(s) Administered   Influenza,inj,Quad PF,6+ Mos 10/26/2015  -no recent labs -denies any chance of pregnancy    Past Medical History:  Diagnosis Date   Anxiety    DR CYNTHIA WHITE FOR MED   Anxiety    Asthma    exercise asthma   Asthma    Chronic kidney disease AGE 20   REFLUX   Chronic kidney disease    Depression AS  TEEN   Depression    Frequent UTI    Infection    FREQUENT UTI A CHILD   PONV (postoperative nausea and vomiting)    Urinary reflux    as child    Current Outpatient Medications on File Prior to Visit  Medication Sig Dispense Refill   ALPRAZolam (XANAX) 0.5 MG tablet  1/2-1 tab po q day as needed anxiety 15 tablet 0   baclofen (LIORESAL) 10 MG tablet TAKE 1 TABLET BY MOUTH THREE TIMES A DAY 90 tablet 8   Biotin 10 MG TABS Take by mouth 2 (two) times daily.     Calcium Carb-Cholecalciferol (CALCIUM 500 + D PO) Take 1 tablet by mouth in the morning, at noon, and at bedtime.     co-enzyme Q-10 30 MG capsule Take 30 mg by mouth 2 (two) times daily.     hydrochlorothiazide (MICROZIDE) 12.5 MG capsule hydrochlorothiazide 12.5 mg capsule     L-ARGININE PO Take by mouth daily.     meloxicam (MOBIC) 15 MG tablet Take 15 mg by mouth daily.     Multiple Vitamins-Minerals (MULTIVITAMIN WITH MINERALS) tablet Take 1 tablet by mouth daily.     nitrofurantoin, macrocrystal-monohydrate, (MACROBID) 100 MG capsule 1 tab po q day after sex. 30 capsule 1   nitrofurantoin, macrocrystal-monohydrate, (MACROBID) 100 MG capsule Take 1 capsule (100 mg total) by mouth 2 (two) times daily. 14 capsule 0   NON FORMULARY 2 mushroom supplements twice a day     SRONYX 0.1-20 MG-MCG tablet Take 1 tablet by mouth daily.     SUMAtriptan (IMITREX) 100 MG tablet Take 1 tablet (100 mg total) by mouth every 2 (two) hours as needed for migraine. May repeat in 2 hours if headache persists or recurs. 12 tablet  0   UNABLE TO FIND CBD OIL     UNABLE TO FIND 4-aminopyradine,Take 1 tablet 5 mg q 12 hours (write this instead of bid). 180 each 1   No current facility-administered medications on file prior to visit.    Observations/Objective: Patient sounds cheerful and well on the phone. I do not appreciate any SOB. Speech and thought processing are grossly intact. Patient reported vitals:  Assessment and Plan:  COVID-19   Discussed treatment options and risk of drug interactions, ideal treatment window, potential complications, isolation and precautions for COVID-19.  Discussed possibility of rebound with antivirals and the need to reisolate if it should occur for 5 days. After lengthy discussion,  the patient opted for treatment with an antiviral due to being higher risk for complications of covid or severe disease and other factors. Discussed EUA status of this drug and the fact that there is preliminary limited knowledge of risks/interactions/side effects per EUA document vs possible benefits and precautions. Discussed risk with this drug and pregnancy and she assures me there is no chance of pregnancy. This information was shared with patient during the visit and also was provided in patient instructions. Also, advised that patient discuss risks/interactions and use with pharmacist/treatment team as well.  The patient did want a prescription for cough, Tessalon Rx sent.  Other symptomatic care measures summarized in patient instructions. Work/School slipped offered:  declined Advised to seek prompt virtual visit or in person care if worsening, new symptoms arise, any further elevation or persistent tachy, CP, SOB, dizziness, andy severe symtpoms or if is not improving with treatment as expected per our conversation of expected course. Discussed options for follow up care. Did let this patient know that I do telemedicine on Tuesdays and Thursdays for [facility] [facility] and those are the days I am logged into the system. Advised to schedule follow up visit with PCP, [facility] virtual visits or [facility] if any further questions or concerns to avoid delays in care.   I discussed the assessment and treatment plan with the patient. The patient was provided an opportunity to ask questions and all were answered. The patient agreed with the plan and demonstrated an understanding of the instructions.    Follow Up Instructions:  I did not refer this patient for an OV with me in the next 24 hours for this/these issue(s).  I discussed the assessment and treatment plan with the patient. The patient was provided an opportunity to ask questions and all were answered. The patient agreed with the plan and demonstrated an  understanding of the instructions.   I spent 18 minutes on the date of this visit in the care of this patient. See summary of tasks completed to properly care for this patient in the detailed notes above which also included counseling of above, review of PMH, medications, allergies, evaluation of the patient and ordering and/or  instructing patient on testing and care options.     Terressa Koyanagi, DO

## 2021-04-01 ENCOUNTER — Other Ambulatory Visit: Payer: Self-pay | Admitting: Medical

## 2021-04-01 MED ORDER — ALPRAZOLAM 0.5 MG PO TABS: ORAL_TABLET | ORAL | 0 refills | Status: DC

## 2021-04-01 NOTE — Telephone Encounter (Addendum)
Requesting: alprazolam 0.5mg   ?Contract: None ?UDS: None ?Last Visit: 12/31/2020 ?Next Visit: None ?Last Refill: 12/31/2020 #15 and 0RF ? ?Please Advise ? ? ?Let pt know sent in 8 tab rx. Want her to have appointment in about 4-6 weeks to review anxiety. Discuss  probable contract and uds. ? ?Esperanza Richters, PA-C  ? ?

## 2021-04-03 ENCOUNTER — Encounter: Payer: Self-pay | Admitting: Medical

## 2021-04-04 ENCOUNTER — Encounter: Payer: Self-pay | Admitting: Medical

## 2021-04-04 ENCOUNTER — Telehealth: Payer: Self-pay | Admitting: Medical

## 2021-04-04 NOTE — Telephone Encounter (Signed)
This is her last message to me. See prior yesterday  message and my response. I just sent her message asking her to come in. Did not respond to various comments she made. If she has additoinal message complaining may need you to call her. ? ? ?I should make an appointment then? For what should I say? A medication refill? A drug screening? A discussion with you? These are the things I would need you to tell me in the future, please, so I can do what is required of me. And we discussed my lapse in needing medication with my mothers health and then back on it after her passing in 2019, with the same disease I have been diagnosed with which, I do not have the date in front of me, but I was happy to give a drug test then when you asked, which my insurance charged me 80$ for,and charged my insurance close to I believe 1,000$!! I have moved to [place], which is something else I discussed with you and how you were a caring doctor so I wanted to keep coming to you, and I thought that was the reason for the infrequent visits (or tele-visits) instead for convenience reasons. But I will gladly make the drive to [place] to see you, you just have to communicate that to me then if that is what is needed. This seemingly-to-me randomized prescription change caused me anxiety thinking something had gone wrong?  ?The last tele-visit I had with you, you seemed to not remember any of our previous arrangements, including my uti medications. It was so strange because you are usually a very caring doctor and I felt you were irritated with me. Anyway, moving forward,if you could just discuss what is required of me with me,I?d be happy to oblige. Thank you.  ?

## 2021-04-23 ENCOUNTER — Ambulatory Visit (HOSPITAL_BASED_OUTPATIENT_CLINIC_OR_DEPARTMENT_OTHER)
Admission: RE | Admit: 2021-04-23 | Discharge: 2021-04-23 | Disposition: A | Discharge status: Home / Self Care | Payer: BC Managed Care – PPO | Source: Ambulatory Visit | Attending: Medical | Admitting: Medical

## 2021-04-23 ENCOUNTER — Ambulatory Visit: Payer: BC Managed Care – PPO | Admitting: Medical

## 2021-04-23 ENCOUNTER — Encounter: Payer: Self-pay | Admitting: Medical

## 2021-04-23 VITALS — BP 130/80 | HR 100 | Resp 18 | Ht 67.0 in | Wt 115.2 lb

## 2021-04-23 DIAGNOSIS — R739 Hyperglycemia, unspecified: Secondary | ICD-10-CM | POA: Diagnosis not present

## 2021-04-23 DIAGNOSIS — M546 Pain in thoracic spine: Secondary | ICD-10-CM | POA: Diagnosis not present

## 2021-04-23 DIAGNOSIS — F419 Anxiety disorder, unspecified: Secondary | ICD-10-CM

## 2021-04-23 DIAGNOSIS — Z79899 Other long term (current) drug therapy: Secondary | ICD-10-CM | POA: Diagnosis not present

## 2021-04-23 DIAGNOSIS — G118 Other hereditary ataxias: Secondary | ICD-10-CM

## 2021-04-23 DIAGNOSIS — I10 Essential (primary) hypertension: Secondary | ICD-10-CM

## 2021-04-23 DIAGNOSIS — G8929 Other chronic pain: Secondary | ICD-10-CM | POA: Insufficient documentation

## 2021-04-23 DIAGNOSIS — M545 Low back pain, unspecified: Secondary | ICD-10-CM | POA: Diagnosis not present

## 2021-04-23 DIAGNOSIS — M5440 Lumbago with sciatica, unspecified side: Secondary | ICD-10-CM | POA: Diagnosis not present

## 2021-04-23 LAB — COMPREHENSIVE METABOLIC PANEL
ALT: 24 U/L (ref 0–35)
AST: 28 U/L (ref 0–37)
Albumin: 5.1 g/dL (ref 3.5–5.2)
Alkaline Phosphatase: 53 U/L (ref 39–117)
BUN: 10 mg/dL (ref 6–23)
CO2: 29 mEq/L (ref 19–32)
Calcium: 10.2 mg/dL (ref 8.4–10.5)
Chloride: 101 mEq/L (ref 96–112)
Creatinine, Ser: 0.75 mg/dL (ref 0.40–1.20)
GFR: 102.56 mL/min (ref >60.00)
Glucose, Bld: 86 mg/dL (ref 70–99)
Potassium: 3.6 mEq/L (ref 3.5–5.1)
Sodium: 138 mEq/L (ref 135–145)
Total Bilirubin: 0.9 mg/dL (ref 0.2–1.2)
Total Protein: 7.2 g/dL (ref 6.0–8.3)

## 2021-04-23 MED ORDER — ALPRAZOLAM 0.5 MG PO TABS: ORAL_TABLET | ORAL | 0 refills | Status: DC

## 2021-04-23 MED ORDER — MELOXICAM 7.5 MG PO TABS: ORAL_TABLET | ORAL | 0 refills | Status: DC

## 2021-04-23 MED ORDER — BUSPIRONE HCL 7.5 MG PO TABS: 7.5000 mg | ORAL_TABLET | Freq: Two times a day (BID) | ORAL | 0 refills | Status: DC

## 2021-04-23 NOTE — Patient Instructions (Addendum)
For anxiety I am prescribing buspar for daily lower level anxiety and making xanax available for more severe anxiety/panic attack. UDS today and contract as well. Discussed want you to have visit at least every 6 months. ? ?For back pain get xray of lumbar spine and tspine. Rx mobic low dose. Rx advisement. ? ?Elevated sugar in past. Get cmp to check kidney function as well. ? ?Bp well controlled on recheck 130/80. Continue hctz 12. 5 mg daily. ? ?For wt loss you have had negative work up at [facility]. Recent weight gain. If you start to loose weight gain then can consider ct abd/pelvis. ? 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 ? ?Follow up 6 [place] or sooner if needed. ? ? ? ? ?

## 2021-04-23 NOTE — Progress Notes (Signed)
? ?Subjective:  ? ? Patient ID: Morgan Day, female    DOB: [birth year], 36 y.o.   MRN: [id number] ? ?HPI ? ?Pt in for follow up ? ?Pt had moved to [place] and that is 30 minutes away.  ? ?Pt states had recent increase stress related to study and having to move to [place]. She used a lot of xanax when very anxious with most recent covid infection. ? ?Also a lot of stress with father in law living in house. ? ?Also as severe neurologic diagnosis. SCA-3. ? ?Pt also has seen other provider to evaluate he weight loss. No cause found when I reviewed on care everywhere.   ? ?"Low weight ?Chronic for over 8 years, likely secondary to chronic disease, initial lab investigation reassuring, further laboratory workup ?No red flag warning signs/symptoms such as weight loss since last visit, fatigue, night sweats, cough, change in bowel habits, blood in stools  ?- Comprehensive Metabolic Panel ?- Fecal Occult Blood Immunoassay Stool ?- Celiac Disease Panel ? ?Generalized anxiety disorder ?Panic disorder ?Pt previously on PRN Xanax for panic attacks, discussed risk/benefit of benzodiazepines, recommend hydroxyzine. Rx sent, close follow-up. ?Discussed common side effects of hydroxyzine with patient. Denies concurrent depression.  ?- hydrOXYzine HCl (ATARAX) 25 mg tablet; Take one tablet (25 mg dose) by mouth 3 (three) times a day as needed for Anxiety for up to 30 doses. ? ?Sacral back pain ?Chronic over several months, imaging ordered, no cauda equina or other red flag symptoms, Mobic for anti-inflammatory, advised to take with food. ?POC upt ordered due to order for imaging and medication prescribing and is negative.  ?- XR Pelvis Min 3 Views ?- POCT Pregnancy, Urine ? ?Follow up in about 2 months (around 11/03/2020) for Follow-up weight." ? ? ?Pt never had xrays done above. Mobic did help and she never got xray.  ? ?LMP- 8 days ago. ? ?Pt went to [facility] at referral of gynecologist. She states they are not her pcp  per pt. ? ? ?Pt states she has been able to gain some weight with very high calorie diet. Such as 3500 calories. ? ? ? ? ?Review of Systems  ?Constitutional:  Negative for chills, fatigue and fever.  ?Respiratory:  Negative for cough, chest tightness, shortness of breath and wheezing.   ?Cardiovascular:  Negative for chest pain and palpitations.  ?Gastrointestinal:  Negative for abdominal pain.  ?Genitourinary:  Negative for dyspareunia, enuresis, flank pain, frequency, hematuria, pelvic pain and urgency.  ?Musculoskeletal:  Negative for back pain and joint swelling.  ?Skin:  Negative for rash.  ? ?Past Medical History:  ?Diagnosis Date  ? Anxiety   ? DR CYNTHIA WHITE FOR MED  ? Anxiety   ? Asthma   ? exercise asthma  ? Asthma   ? Chronic kidney disease AGE 63  ? REFLUX  ? Chronic kidney disease   ? Depression AS  TEEN  ? Depression   ? Frequent UTI   ? Infection   ? FREQUENT UTI A CHILD  ? PONV (postoperative nausea and vomiting)   ? Urinary reflux   ? as child  ? ?  ?Social History  ? ?Socioeconomic History  ? Marital status: Married  ?  Spouse name: MARIO  ? Number of children: 1  ? Years of education: 40  ? Highest education level: Not on file  ?Occupational History  ? Occupation: H0MEMAKER  ?Tobacco Use  ? Smoking status: Former  ?  Types: Cigarettes  ? Smokeless tobacco: Never  ?  Vaping Use  ? Vaping Use: Never used  ?Substance and Sexual Activity  ? Alcohol use: Not Currently  ?  Alcohol/week: 0.0 standard drinks  ?  Comment: social  ? Drug use: Not Currently  ? Sexual activity: Never  ?  Partners: Male  ?  Birth control/protection: Pill  ?  Comment: D/C'D 06/2011  ?Other Topics Concern  ? Not on file  ?Social History Narrative  ? Pt lives with at home with Spouse and 2 children, and with father in law  ? Right handed  ? Drinks coffee, and tea occasionally, and soda   ?    ? ?Social Determinants of Health  ? ?Financial Resource Strain: Not on file  ?Food Insecurity: Not on file  ?Transportation Needs: Not on  file  ?Physical Activity: Not on file  ?Stress: Not on file  ?Social Connections: Not on file  ?Intimate Partner Violence: Not on file  ? ? ?Past Surgical History:  ?Procedure Laterality Date  ? HERNIA REPAIR    ? MOUTH SURGERY    ? NECK SURGERY    ? THYROGLOSSAL DUCT CYST  05/09/2011  ? THYROGLOSSAL DUCT CYST  05/09/2011  ? Procedure: THYROGLOSSAL DUCT CYST;  Surgeon: Jerrell Belfast, MD;  Location: [facility];  Service: ENT;  Laterality: N/A;  ? URINARY SURGERY    ? for reflux   as child  ? ? ?Family History  ?Problem Relation Age of Onset  ? Cancer Mother   ?     BREAST CANCER  ? Ataxia Mother   ?     SCA-3  ? Depression Mother   ? Anxiety disorder Mother   ? Other Mother   ?     NAUSEA WITH  NARCOTICS  ? Hypertension Father   ? Other Sister   ? Cancer Sister   ?     SKIN  ? Other Brother   ?     SCA-3  ? Other Sister   ?     SCA-3  ? Other Maternal Grandmother   ?     SCA  ? Dementia Paternal Grandmother   ? Colon cancer Paternal Grandmother   ? ? ?Allergies  ?Allergen Reactions  ? Other   ?  Narcotic pain medicine she reports makes her nauseated  ? ? ?Current Outpatient Medications on File Prior to Visit  ?Medication Sig Dispense Refill  ? ALPRAZolam (XANAX) 0.5 MG tablet 1/2-1 tab po q day as needed anxiety 8 tablet 0  ? baclofen (LIORESAL) 10 MG tablet TAKE 1 TABLET BY MOUTH THREE TIMES A DAY 90 tablet 8  ? benzonatate (TESSALON PERLES) 100 MG capsule 1-2 capsules up to twice daily as needed for cough 30 capsule 0  ? Biotin 10 MG TABS Take by mouth 2 (two) times daily.    ? Calcium Carb-Cholecalciferol (CALCIUM 500 + D PO) Take 1 tablet by mouth in the morning, at noon, and at bedtime.    ? co-enzyme Q-10 30 MG capsule Take 30 mg by mouth 2 (two) times daily.    ? hydrochlorothiazide (MICROZIDE) 12.5 MG capsule hydrochlorothiazide 12.5 mg capsule    ? L-ARGININE PO Take by mouth daily.    ? meloxicam (MOBIC) 15 MG tablet Take 15 mg by mouth daily.    ? Multiple Vitamins-Minerals (MULTIVITAMIN WITH MINERALS) tablet  Take 1 tablet by mouth daily.    ? nitrofurantoin, macrocrystal-monohydrate, (MACROBID) 100 MG capsule 1 tab po q day after sex. 30 capsule 1  ? nitrofurantoin, macrocrystal-monohydrate, (MACROBID)  100 MG capsule Take 1 capsule (100 mg total) by mouth 2 (two) times daily. 14 capsule 0  ? NON FORMULARY 2 mushroom supplements twice a day    ? SRONYX 0.1-20 MG-MCG tablet Take 1 tablet by mouth daily.    ? SUMAtriptan (IMITREX) 100 MG tablet Take 1 tablet (100 mg total) by mouth every 2 (two) hours as needed for migraine. May repeat in 2 hours if headache persists or recurs. 12 tablet 0  ? UNABLE TO FIND CBD OIL    ? UNABLE TO FIND 4-aminopyradine,Take 1 tablet 5 mg q 12 hours (write this instead of bid). 180 each 1  ? ?No current facility-administered medications on file prior to visit.  ? ? ?BP (!) 150/106   Pulse 100   Resp 18   Ht 5\' 7"  (1.702 m)   Wt 115 lb 3.2 oz (52.3 kg)   SpO2 100%   BMI 18.04 kg/m?  ?  ?   ?Objective:  ? Physical Exam ? ? ?General ?Mental Status- Alert. General Appearance- Not in acute distress.  ? ?Skin ?General: Color- Normal Color. Moisture- Normal Moisture. ? ?Neck ?Carotid Arteries- Normal color. Moisture- Normal Moisture. No carotid bruits. No JVD. ? ?Chest and Lung Exam ?Auscultation: ?Breath Sounds:-Normal. ? ?Cardiovascular ?Auscultation:Rythm- Regular. ?Murmurs & Other Heart Sounds:Auscultation of the heart reveals- No Murmurs. ? ?Abdomen ?Inspection:-Inspeection Normal. ?Palpation/Percussion:Note:No mass. Palpation and Percussion of the abdomen reveal- Non Tender, Non Distended + BS, no rebound or guarding. ? ? ?Neurologic ?Cranial Nerve exam:- CN III-XII intact(No nystagmus), symmetric smile. ?Strength:- 5/5 equal and symmetric strength both upper and lower extremities.  ? ?egular, rate and rythm. Heart Sounds -Normal heart sounds. ? ? ?Back ?Mid lumbar spine tenderness to palpation. ?No Pain on straight leg lift. ?No Pain on lateral movements and flexion/extension of the  spine. ? ?Lower 1/3 thoracic area pain on palpation. ? ?Lower ext neurologic ? ?L5-S1 sensation intact bilaterally. ?Normal patellar reflexes bilaterally. ?No foot drop bilaterally.  ?   ?Assessment & Plan:  ? ?Fraser Din

## 2021-04-25 LAB — DRUG MONITORING PANEL 376104, URINE
Amphetamines: NEGATIVE ng/mL (ref <500)
Barbiturates: NEGATIVE ng/mL (ref <300)
Benzodiazepines: NEGATIVE ng/mL (ref <100)
Cocaine Metabolite: NEGATIVE ng/mL (ref <150)
Desmethyltramadol: NEGATIVE ng/mL (ref <100)
Opiates: NEGATIVE ng/mL (ref <100)
Oxycodone: NEGATIVE ng/mL (ref <100)
Tramadol: NEGATIVE ng/mL (ref <100)

## 2021-04-25 LAB — DM TEMPLATE

## 2021-04-26 DIAGNOSIS — H109 Unspecified conjunctivitis: Secondary | ICD-10-CM | POA: Diagnosis not present

## 2021-04-30 ENCOUNTER — Encounter: Payer: Self-pay | Admitting: Medical

## 2021-05-17 ENCOUNTER — Other Ambulatory Visit: Payer: Self-pay | Admitting: Medical

## 2021-05-21 ENCOUNTER — Other Ambulatory Visit: Payer: Self-pay | Admitting: Medical

## 2021-05-22 DIAGNOSIS — Z3009 Encounter for other general counseling and advice on contraception: Secondary | ICD-10-CM | POA: Diagnosis not present

## 2021-05-22 DIAGNOSIS — I10 Essential (primary) hypertension: Secondary | ICD-10-CM | POA: Diagnosis not present

## 2021-05-22 DIAGNOSIS — Z01419 Encounter for gynecological examination (general) (routine) without abnormal findings: Secondary | ICD-10-CM | POA: Diagnosis not present

## 2021-05-27 ENCOUNTER — Encounter: Payer: Self-pay | Admitting: Neurology

## 2021-05-27 NOTE — Addendum Note (Signed)
Addended by: Anabel Halon on: 05/27/2021 09:52 PM ? ? Modules accepted: Orders ? ?

## 2021-05-29 NOTE — Telephone Encounter (Signed)
Called patient and explained that Dr. Carles Collet is not willing to prescribe this medication off label. I did tell the patient about calling an academic school like [facility] or [facility] to see if they have any clinical trials for this medication off label  ?

## 2021-06-16 ENCOUNTER — Other Ambulatory Visit: Payer: Self-pay | Admitting: Medical

## 2021-06-19 ENCOUNTER — Encounter: Payer: Self-pay | Admitting: Medical

## 2021-07-01 ENCOUNTER — Encounter: Payer: Self-pay | Admitting: Family Medicine

## 2021-07-01 ENCOUNTER — Ambulatory Visit: Payer: BC Managed Care – PPO | Admitting: Family Medicine

## 2021-07-01 VITALS — BP 110/60 | Ht 68.0 in | Wt 116.0 lb

## 2021-07-01 DIAGNOSIS — M5416 Radiculopathy, lumbar region: Secondary | ICD-10-CM | POA: Insufficient documentation

## 2021-07-01 HISTORY — DX: Radiculopathy, lumbar region: M54.16

## 2021-07-01 MED ORDER — CELECOXIB 200 MG PO CAPS: ORAL_CAPSULE | ORAL | 2 refills | Status: DC

## 2021-07-01 NOTE — Progress Notes (Signed)
  Morgan Day - 36 y.o. female MRN [id number]  Date of birth: 05-21-1985  SUBJECTIVE:  Including CC & ROS.  No chief complaint on file.   Morgan Day is a 36 y.o. female that is presenting with acute on chronic low back pain with right-sided radicular pain.  Symptoms have been present for over a year.  She is taken meloxicam with improvement.  Denies a history of injury or inciting event.  No history of surgery.  Her back will go out when she does perform certain movements.  Review of the office note from 4/4 shows she was provided mobic Independent review of the lumbar spine x-ray from 4/4 shows severe degenerative changes at L5-S1. Independent review of the thoracic spine x-ray from 4/4 shows minimal scoliosis change.  Review of Systems See HPI   HISTORY: Past Medical, Surgical, Social, and Family History Reviewed & Updated per EMR.   Pertinent Historical Findings include:  Past Medical History:  Diagnosis Date   Anxiety    DR CYNTHIA WHITE FOR MED   Anxiety    Asthma    exercise asthma   Asthma    Chronic kidney disease AGE 79   REFLUX   Chronic kidney disease    Depression AS  TEEN   Depression    Frequent UTI    Infection    FREQUENT UTI A CHILD   Lumbar radiculopathy 07/01/2021   PONV (postoperative nausea and vomiting)    Urinary reflux    as child    Past Surgical History:  Procedure Laterality Date   HERNIA REPAIR     MOUTH SURGERY     NECK SURGERY     THYROGLOSSAL DUCT CYST  05/09/2011   THYROGLOSSAL DUCT CYST  05/09/2011   Procedure: THYROGLOSSAL DUCT CYST;  Surgeon: Osborn Coho, MD;  [facility];  Service: ENT;  Laterality: N/A;   URINARY SURGERY     for reflux   as child     PHYSICAL EXAM:  VS: BP 110/60   Ht 5\' 8"  (1.727 m)   Wt 116 lb (52.6 kg)   BMI 17.64 kg/m  Physical Exam Gen: NAD, alert, cooperative with exam, well-appearing MSK:  Back: Limited flexion and extension. Weakness with resistance of the right  leg. Positive straight leg raise on the right Neurovascularly intact       ASSESSMENT & PLAN:   Lumbar radiculopathy Acute on chronic in nature.  She has been having symptoms for greater than 1 year.  Has tried greater than 6 weeks of physician directed home exercise therapy starting in April.  Continues to have low back pain with radicular symptoms. -Counseled on home exercise therapy and supportive care. -Stop Mobic and start meloxicam. -MRI of the lumbar spine to evaluate for nerve impingement and consideration of epidural use.

## 2021-07-01 NOTE — Assessment & Plan Note (Signed)
Acute on chronic in nature.  She has been having symptoms for greater than 1 year.  Has tried greater than 6 weeks of physician directed home exercise therapy starting in April.  Continues to have low back pain with radicular symptoms. -Counseled on home exercise therapy and supportive care. -Stop Mobic and start meloxicam. -MRI of the lumbar spine to evaluate for nerve impingement and consideration of epidural use.

## 2021-07-01 NOTE — Patient Instructions (Signed)
Nice to meet you Please try heat  Please consider the compression  Please try the exercise  We will get the MRI at [facility] med center Please send me a message in MyChart with any questions or updates.  We will set up a virtual visit once the MRI is resulted..   --Dr. Jordan Likes

## 2021-07-13 ENCOUNTER — Ambulatory Visit (INDEPENDENT_AMBULATORY_CARE_PROVIDER_SITE_OTHER): Payer: BC Managed Care – PPO

## 2021-07-13 DIAGNOSIS — M5416 Radiculopathy, lumbar region: Secondary | ICD-10-CM

## 2021-07-13 DIAGNOSIS — M4316 Spondylolisthesis, lumbar region: Secondary | ICD-10-CM | POA: Diagnosis not present

## 2021-07-15 ENCOUNTER — Telehealth (INDEPENDENT_AMBULATORY_CARE_PROVIDER_SITE_OTHER): Payer: BC Managed Care – PPO | Admitting: Family Medicine

## 2021-07-15 ENCOUNTER — Other Ambulatory Visit: Payer: Self-pay | Admitting: Neurology

## 2021-07-15 ENCOUNTER — Other Ambulatory Visit: Payer: Self-pay | Admitting: Medical

## 2021-07-15 ENCOUNTER — Encounter: Payer: Self-pay | Admitting: Family Medicine

## 2021-07-15 DIAGNOSIS — M5416 Radiculopathy, lumbar region: Secondary | ICD-10-CM | POA: Diagnosis not present

## 2021-07-15 DIAGNOSIS — G118 Other hereditary ataxias: Secondary | ICD-10-CM

## 2021-07-15 DIAGNOSIS — R1319 Other dysphagia: Secondary | ICD-10-CM

## 2021-07-16 ENCOUNTER — Other Ambulatory Visit: Payer: Self-pay | Admitting: Family Medicine

## 2021-07-16 MED ORDER — DIAZEPAM 5 MG PO TABS: ORAL_TABLET | ORAL | 0 refills | Status: DC

## 2021-07-19 ENCOUNTER — Ambulatory Visit
Admission: RE | Admit: 2021-07-19 | Discharge: 2021-07-19 | Disposition: A | Discharge status: Home / Self Care | Payer: BC Managed Care – PPO | Source: Ambulatory Visit | Attending: Family Medicine | Admitting: Family Medicine

## 2021-07-19 DIAGNOSIS — M5136 Other intervertebral disc degeneration, lumbar region: Secondary | ICD-10-CM | POA: Diagnosis not present

## 2021-07-19 DIAGNOSIS — M5416 Radiculopathy, lumbar region: Secondary | ICD-10-CM

## 2021-07-19 DIAGNOSIS — M47817 Spondylosis without myelopathy or radiculopathy, lumbosacral region: Secondary | ICD-10-CM | POA: Diagnosis not present

## 2021-07-19 MED ORDER — IOPAMIDOL (ISOVUE-M 200) INJECTION 41%
1.0000 mL | Freq: Once | INTRAMUSCULAR | Status: AC
  Administered 2021-07-19: 1 mL via EPIDURAL

## 2021-07-19 MED ORDER — METHYLPREDNISOLONE ACETATE 40 MG/ML INJ SUSP (RADIOLOG
80.0000 mg | Freq: Once | INTRAMUSCULAR | Status: AC
  Administered 2021-07-19: 80 mg via EPIDURAL

## 2021-07-19 NOTE — Discharge Instructions (Signed)

## 2021-08-05 ENCOUNTER — Encounter: Payer: Self-pay | Admitting: Neurology

## 2021-08-06 NOTE — Telephone Encounter (Signed)
None of [place] have talked to her pharmacy, tried calling the pharmacy this morning they have not opened yet will call back later,

## 2021-08-14 ENCOUNTER — Other Ambulatory Visit: Payer: Self-pay | Admitting: Sports Medicine

## 2021-08-14 ENCOUNTER — Other Ambulatory Visit: Payer: Self-pay | Admitting: *Deleted

## 2021-08-14 DIAGNOSIS — M5416 Radiculopathy, lumbar region: Secondary | ICD-10-CM

## 2021-08-14 MED ORDER — DIAZEPAM 5 MG PO TABS: ORAL_TABLET | ORAL | 0 refills | Status: DC

## 2021-10-08 ENCOUNTER — Ambulatory Visit: Payer: BC Managed Care – PPO | Admitting: Medical

## 2021-10-08 VITALS — BP 154/97 | HR 114 | Temp 98.6°F | Resp 18 | Ht 68.0 in | Wt 116.0 lb

## 2021-10-08 DIAGNOSIS — R8290 Unspecified abnormal findings in urine: Secondary | ICD-10-CM

## 2021-10-08 DIAGNOSIS — Z87440 Personal history of urinary (tract) infections: Secondary | ICD-10-CM | POA: Diagnosis not present

## 2021-10-08 DIAGNOSIS — N898 Other specified noninflammatory disorders of vagina: Secondary | ICD-10-CM | POA: Diagnosis not present

## 2021-10-08 DIAGNOSIS — S90212A Contusion of left great toe with damage to nail, initial encounter: Secondary | ICD-10-CM

## 2021-10-08 LAB — POC URINALSYSI DIPSTICK (AUTOMATED)
Bilirubin, UA: NEGATIVE
Blood, UA: NEGATIVE
Glucose, UA: NEGATIVE
Ketones, UA: NEGATIVE
Nitrite, UA: POSITIVE
Protein, UA: NEGATIVE
Spec Grav, UA: 1.01 (ref 1.010–1.025)
Urobilinogen, UA: 0.2 E.U./dL
pH, UA: 7 (ref 5.0–8.0)

## 2021-10-08 MED ORDER — MUPIROCIN 2 % EX OINT: 1.0000 | TOPICAL_OINTMENT | Freq: Two times a day (BID) | CUTANEOUS | 0 refills | Status: DC

## 2021-10-08 NOTE — Progress Notes (Signed)
Subjective:    Patient ID: Fayne Mediate Dinkel, female    DOB: Sep 08, 1985, 36 y.o.   MRN: [id number]  HPI Pt has recent odor to urine last 3-4 days. Has histoy of getting uti very easily in past. No fever, no chills, no cva tenderness and no abdomen pain.   Pt also states after walking daily in [place] for one day got blister walking a lot. This was 2 weeks ago. She got blister at base of nail and blister at tip of great toe. She ruptured blister day before yesterday. No creamy yellow DC.  Pt states grazed toe again great toe and describes blister popped and serosanguinous drainage.    Review of Systems  Constitutional:  Negative for chills, fatigue and fever.  Respiratory:  Negative for cough, chest tightness, shortness of breath and wheezing.   Cardiovascular:  Negative for chest pain and palpitations.  Gastrointestinal:  Negative for abdominal pain.  Genitourinary:  Negative for urgency.  Musculoskeletal:  Negative for back pain.       See hpi.  Skin:  Negative for rash.    Past Medical History:  Diagnosis Date   Anxiety    DR CYNTHIA WHITE FOR MED   Anxiety    Asthma    exercise asthma   Asthma    Chronic kidney disease AGE 32   REFLUX   Chronic kidney disease    Depression AS  TEEN   Depression    Frequent UTI    Infection    FREQUENT UTI A CHILD   Lumbar radiculopathy 07/01/2021   PONV (postoperative nausea and vomiting)    Urinary reflux    as child     Social History   Socioeconomic History   Marital status: Married    Spouse name: MARIO   Number of children: 1   Years of education: 13   Highest education level: Not on file  Occupational History   Occupation: [id number]  Tobacco Use   Smoking status: Former    Types: Cigarettes   Smokeless tobacco: Never  Vaping Use   Vaping Use: Never used  Substance and Sexual Activity   Alcohol use: Not Currently    Alcohol/week: 0.0 standard drinks of alcohol    Comment: social   Drug use: Not Currently    Sexual activity: Never    Partners: Male    Birth control/protection: Pill    Comment: D/C'D 06/2011  Other Topics Concern   Not on file  Social History Narrative   Pt lives with at home with Spouse and 2 children, and with father in law   Right handed   Drinks coffee, and tea occasionally, and soda        Social Determinants of Health   Financial Resource Strain: Not on file  Food Insecurity: Not on file  Transportation Needs: Not on file  Physical Activity: Not on file  Stress: Not on file  Social Connections: Not on file  Intimate Partner Violence: Not on file    Past Surgical History:  Procedure Laterality Date   HERNIA REPAIR     MOUTH SURGERY     NECK SURGERY     THYROGLOSSAL DUCT CYST  05/09/2011   THYROGLOSSAL DUCT CYST  05/09/2011   Procedure: THYROGLOSSAL DUCT CYST;  Surgeon: Osborn Coho, MD;  Location: [facility] OR;  Service: ENT;  Laterality: N/A;   URINARY SURGERY     for reflux   as child    Family History  Problem Relation Age  of Onset   Cancer Mother        BREAST CANCER   Ataxia Mother        SCA-3   Depression Mother    Anxiety disorder Mother    Other Mother        NAUSEA WITH  NARCOTICS   Hypertension Father    Other Sister    Cancer Sister        SKIN   Other Brother        SCA-3   Other Sister        SCA-3   Other Maternal Grandmother        SCA   Dementia Paternal Grandmother    Colon cancer Paternal Grandmother     Allergies  Allergen Reactions   Other     Narcotic pain medicine she reports makes her nauseated    Current Outpatient Medications on File Prior to Visit  Medication Sig Dispense Refill   ALPRAZolam (XANAX) 0.5 MG tablet 1/2-1 tab po q day as needed anxiety 8 tablet 0   ALPRAZolam (XANAX) 0.5 MG tablet 1 tab po daily as needed  prn severe anxiety 30 tablet 0   baclofen (LIORESAL) 10 MG tablet TAKE 1 TABLET BY MOUTH THREE TIMES A DAY 90 tablet 8   benzonatate (TESSALON PERLES) 100 MG capsule 1-2 capsules up to twice  daily as needed for cough 30 capsule 0   Biotin 10 MG TABS Take by mouth 2 (two) times daily.     busPIRone (BUSPAR) 7.5 MG tablet TAKE 1 TABLET BY MOUTH 2 TIMES DAILY. 180 tablet 1   Calcium Carb-Cholecalciferol (CALCIUM 500 + D PO) Take 1 tablet by mouth in the morning, at noon, and at bedtime.     celecoxib (CELEBREX) 200 MG capsule One to 2 tablets by mouth daily as needed for pain. 60 capsule 2   co-enzyme Q-10 30 MG capsule Take 30 mg by mouth 2 (two) times daily.     diazepam (VALIUM) 5 MG tablet Please take 30-45 minutes prior to procedure. May repeat x 1. 2 tablet 0   hydrochlorothiazide (MICROZIDE) 12.5 MG capsule hydrochlorothiazide 12.5 mg capsule     L-ARGININE PO Take by mouth daily.     Multiple Vitamins-Minerals (MULTIVITAMIN WITH MINERALS) tablet Take 1 tablet by mouth daily.     nitrofurantoin, macrocrystal-monohydrate, (MACROBID) 100 MG capsule 1 tab po q day after sex. 30 capsule 1   nitrofurantoin, macrocrystal-monohydrate, (MACROBID) 100 MG capsule Take 1 capsule (100 mg total) by mouth 2 (two) times daily. 14 capsule 0   NON FORMULARY 2 mushroom supplements twice a day     SRONYX 0.1-20 MG-MCG tablet Take 1 tablet by mouth daily.     SUMAtriptan (IMITREX) 100 MG tablet Take 1 tablet (100 mg total) by mouth every 2 (two) hours as needed for migraine. May repeat in 2 hours if headache persists or recurs. 12 tablet 0   UNABLE TO FIND CBD OIL     UNABLE TO FIND 4-aminopyradine,Take 1 tablet 5 mg q 12 hours (write this instead of bid). 180 each 1   No current facility-administered medications on file prior to visit.    BP (!) 154/97   Pulse (!) 114   Temp 98.6 F (37 C)   Resp 18   Ht 5\' 8"  (1.727 m)   Wt 116 lb (52.6 kg)   SpO2 97%   BMI 17.64 kg/m        Objective:   Physical Exam  General- No acute distress. Pleasant patient. Neck- Full range of motion, no jvd Lungs- Clear, even and unlabored. Heart- regular rate and rhythm. Neurologic- CNII- XII grossly  intact.  Left great toe- faint bruise at base of nail. Faint light subungal hematoma.  Back- no cva tenderness. Abdomen- soft, nt, nd, +bs, no rebound or guarding.       Assessment & Plan:   Patient Instructions  Recent repetitive injury to left great toe toe area. Faint subungual hematoma with mild bruise. Nail is mild loose. Recommend mupirocin to base of toe and use Band-Aid over. Avoid trauma. If nail loosening further would refer to podiatrist to consider removing nail. Avoid re-trauma.  If any severe redness to base of toe or yellow dc would give oral antibiotic. Not indicated presently.  For urine odor and potential vaginal odor get urine culture, poct ua and urine ancillary studies. Will treat based on results of test.

## 2021-10-08 NOTE — Patient Instructions (Signed)
Recent repetitive injury to left great toe toe area. Faint subungual hematoma with mild bruise. Nail is mild loose. Recommend mupirocin to base of toe and use Band-Aid over. Avoid trauma. If nail loosening further would refer to podiatrist to consider removing nail. Avoid re-trauma.  If any severe redness to base of toe or yellow dc would give oral antibiotic. Not indicated presently.  For urine odor and potential vaginal odor get urine culture, poct ua and urine ancillary studies. Will treat based on results of test.

## 2021-10-11 LAB — URINE CULTURE
MICRO NUMBER:: 13937818
SPECIMEN QUALITY:: ADEQUATE

## 2021-10-11 MED ORDER — CEPHALEXIN 500 MG PO CAPS: 500.0000 mg | ORAL_CAPSULE | Freq: Two times a day (BID) | ORAL | 0 refills | Status: DC

## 2021-10-11 NOTE — Addendum Note (Signed)
Addended by: Anabel Halon on: 10/11/2021 12:20 PM   Modules accepted: Orders

## 2021-10-23 ENCOUNTER — Telehealth (INDEPENDENT_AMBULATORY_CARE_PROVIDER_SITE_OTHER): Payer: BC Managed Care – PPO | Admitting: Medical

## 2021-10-23 VITALS — BP 102/84

## 2021-10-23 DIAGNOSIS — G8929 Other chronic pain: Secondary | ICD-10-CM | POA: Diagnosis not present

## 2021-10-23 DIAGNOSIS — M5440 Lumbago with sciatica, unspecified side: Secondary | ICD-10-CM

## 2021-10-23 DIAGNOSIS — I10 Essential (primary) hypertension: Secondary | ICD-10-CM

## 2021-10-23 DIAGNOSIS — F419 Anxiety disorder, unspecified: Secondary | ICD-10-CM | POA: Diagnosis not present

## 2021-10-23 MED ORDER — CELECOXIB 100 MG PO CAPS: ORAL_CAPSULE | ORAL | 0 refills | Status: DC

## 2021-10-23 NOTE — Patient Instructions (Addendum)
Anxiety- controlled with sporadic use of xanax. Up to date on uds and controlled med contract. Let me know when you need refill  Htn- continue hctz. Check bp at home and please up date me.   For chronic back pain referred back to sports med. Please call office. Can use celebrex 200 mg daily if needed for paain. Important to make sure bp well controlled in 130/80 range as nsaids can increase bp.  Follow up date to be determined after bp review. Please send me my chart message on bp reading

## 2021-10-23 NOTE — Progress Notes (Signed)
   Subjective:    Patient ID: Morgan Day, female    DOB: April 16, 1985, 36 y.o.   MRN: [id number]  HPI  Virtual Visit via Video Note  I connected with Morgan Day on 10/23/21 at  9:40 AM EDT by a video enabled telemedicine application and verified that I am speaking with the correct person using two identifiers.  Location: Patient: home Provider: office   I discussed the limitations of evaluation and management by telemedicine and the availability of in person appointments. The patient expressed understanding and agreed to proceed.  History of Present Illness:   Pt in for follow up.  Pt has anxiety.   Pt states in past felt weird with buspar. She states had brief palpiations when on. She stopped and palpiattions never returned. Pt has only been using xanax about 3 times a Knights. She states at night will have panic attack. Xanax will resolve symptoms.  Pt last rx was in march. She still has tabs left.   Htn- pt is on hctz low dose. Pt has not checked her blood pressure.   On review pt still has back pain. She has djd and has seen orthopedist. Pt describes got epidural but did not help.    IMPRESSION: 1. L5-S1 anterior fusion without residual spinal canal or neural foraminal stenosis. 2. L4-L5 disc bulge with left extraforaminal annular fissure in close proximity to the exiting left L4 nerve root.     Observations/Objective: General-no acute distress, pleasant, oriented. Lungs- on inspection lungs appear unlabored. Neck- no tracheal deviation or jvd on inspection. Neuro- gross motor function appears intact.    Assessment and Plan: Patient Instructions  Anxiety- controlled with sporadic use of xanax. Up to date on uds and controlled med contract. Let me know when you need refill  Htn- continue hctz. Check bp at home and please up date me.   For chronic back pain referred back to sports med. Please call office. Can use celebrex 200 mg daily if needed.  Important to make sure bp well controlled in 130/80 range.  Follow up date to be determined after bp review. Please send me my chart message on bp readings.     Mackie Pai, PA-C   Follow Up Instructions:    I discussed the assessment and treatment plan with the patient. The patient was provided an opportunity to ask questions and all were answered. The patient agreed with the plan and demonstrated an understanding of the instructions.   The patient was advised to call back or seek an in-person evaluation if the symptoms worsen or if the condition fails to improve as anticipated.     Mackie Pai, PA-C   Review of Systems     Objective:   Physical Exam        Assessment & Plan:

## 2021-10-25 ENCOUNTER — Encounter: Payer: Self-pay | Admitting: Medical

## 2021-10-28 ENCOUNTER — Ambulatory Visit: Payer: BC Managed Care – PPO | Admitting: Family Medicine

## 2021-10-28 ENCOUNTER — Encounter: Payer: Self-pay | Admitting: Medical

## 2021-10-28 ENCOUNTER — Encounter: Payer: Self-pay | Admitting: Family Medicine

## 2021-10-28 ENCOUNTER — Ambulatory Visit: Payer: Self-pay

## 2021-10-28 VITALS — BP 138/90 | Ht 68.0 in | Wt 116.0 lb

## 2021-10-28 DIAGNOSIS — M533 Sacrococcygeal disorders, not elsewhere classified: Secondary | ICD-10-CM

## 2021-10-28 HISTORY — DX: Sacrococcygeal disorders, not elsewhere classified: M53.3

## 2021-10-28 MED ORDER — TRIAMCINOLONE ACETONIDE 40 MG/ML IJ SUSP
40.0000 mg | Freq: Once | INTRAMUSCULAR | Status: AC
  Administered 2021-10-28: 40 mg via INTRA_ARTICULAR

## 2021-10-28 NOTE — Patient Instructions (Signed)
Good to see you Please try heat  Please consider the compression   Please send me a message in MyChart with any questions or updates.  Please see me back in 4-6 weeks.   --Dr. Raeford Razor

## 2021-10-28 NOTE — Progress Notes (Signed)
  Morgan Day - 36 y.o. female MRN [id number]  Date of birth: 1985/06/09  SUBJECTIVE:  Including CC & ROS.  No chief complaint on file.   Morgan Day is a 36 y.o. female that is  presenting with acute on chronic low back pain, SI joint pain on the left and left leg pain.  No improvement with previous epidural.   Review of Systems See HPI   HISTORY: Past Medical, Surgical, Social, and Family History Reviewed & Updated per EMR.   Pertinent Historical Findings include:  Past Medical History:  Diagnosis Date   Anxiety    DR CYNTHIA WHITE FOR MED   Anxiety    Asthma    exercise asthma   Asthma    Chronic kidney disease AGE 23   REFLUX   Chronic kidney disease    Depression AS  TEEN   Depression    Frequent UTI    Infection    FREQUENT UTI A CHILD   Lumbar radiculopathy 07/01/2021   PONV (postoperative nausea and vomiting)    Urinary reflux    as child    Past Surgical History:  Procedure Laterality Date   HERNIA REPAIR     MOUTH SURGERY     NECK SURGERY     THYROGLOSSAL DUCT CYST  05/09/2011   THYROGLOSSAL DUCT CYST  05/09/2011   Procedure: THYROGLOSSAL DUCT CYST;  Surgeon: Jerrell Belfast, MD;  [facility];  Service: ENT;  Laterality: N/A;   URINARY SURGERY     for reflux   as child     PHYSICAL EXAM:  VS: BP (!) 138/90 (BP Location: Right Arm, Patient Position: Sitting)   Ht 5\' 8"  (1.727 m)   Wt 116 lb (52.6 kg)   BMI 17.64 kg/m  Physical Exam Gen: NAD, alert, cooperative with exam, well-appearing MSK:  Neurovascularly intact     Aspiration/Injection Procedure Note Morgan Day 08/19/1985  Procedure: Injection Indications: Left SI joint pain  Procedure Details Consent: Risks of procedure as well as the alternatives and risks of each were explained to the (patient/caregiver).  Consent for procedure obtained. Time Out: Verified patient identification, verified procedure, site/side was marked, verified correct patient  position, special equipment/implants available, medications/allergies/relevent history reviewed, required imaging and test results available.  Performed.  The area was cleaned with iodine and alcohol swabs.    The left SI joint was injected using 3 cc of 1% lidocaine on a 22-gauge 1-1/2 inch needle.  The syringe was switched and a mixture containing 1 cc's of 40 mg Kenalog and 4 cc's of 0.25% bupivacaine was injected.  Ultrasound was used. Images were obtained in long views showing the injection.     A sterile dressing was applied.  Patient did tolerate procedure well.     ASSESSMENT & PLAN:   Sacroiliac joint dysfunction of left side Acutely occurring.  Has tenderness at the left SI joint.  She has an here bony fusion between L5 and S1 which may be contributing. -Counseled on home exercise therapy and supportive care. -Injection today -Could consider physical therapy

## 2021-10-28 NOTE — Assessment & Plan Note (Signed)
Acutely occurring.  Has tenderness at the left SI joint.  She has an here bony fusion between L5 and S1 which may be contributing. -Counseled on home exercise therapy and supportive care. -Injection today -Could consider physical therapy

## 2021-11-26 NOTE — Progress Notes (Unsigned)
Assessment/Plan:   1.  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  Understands the concept of anticipation.  -Patient is on off label 4-aminopyridine, by her request.  Understands the risk of seizure with this medication.  -handicap placard filled out today.  -Reviewed clinical trials.gov and currently there is no SCA-3 study recruiting other than tracking of natural history of disease   2.  Diplopia  -needs to f/u with optometry.  In theory, she needs ophthalmology.  Discussed that again today.  She asks for referral and we will do that.  3.  Intermittent dysphagia  -MBE done is 2021 was good  4.  M. Spasm  -continue baclofen to 10 mg tid  5. F/u 1 year Subjective:   Morgan Day was seen today in follow up for spinocerebellar ataxia type III.  My previous records as well as any outside records available were reviewed prior to todays visit.  Pt is currently on 4-aminopyridine, 5 mg every 12 hours.    She has had no falls since last visit but has had some near falls.  She uses baclofen for muscle spasm.  She has been following with sports med for lumbar radiculopathy and SI joint dysfunction.  She was hoping to get enrolled in a clinical trial, but ultimately she was not approved for the trial.  She asked me about prescribing riluzole, but I was not willing to do that given fairly significant liver disease associated with that medication, and no evidence of benefit.  We did offer opinion at tertiary care center, however. CURRENT MEDICATIONS:  Outpatient Encounter Medications as of 11/28/2021  Medication Sig   ALPRAZolam (XANAX) 0.5 MG tablet 1/2-1 tab po q day as needed anxiety   ALPRAZolam (XANAX) 0.5 MG tablet 1 tab po daily as needed  prn severe anxiety   baclofen (LIORESAL) 10 MG tablet TAKE 1 TABLET BY MOUTH THREE TIMES A DAY   benzonatate (TESSALON PERLES) 100 MG capsule 1-2 capsules up to twice daily as needed for cough    Biotin 10 MG TABS Take by mouth 2 (two) times daily.   busPIRone (BUSPAR) 7.5 MG tablet TAKE 1 TABLET BY MOUTH 2 TIMES DAILY.   Calcium Carb-Cholecalciferol (CALCIUM 500 + D PO) Take 1 tablet by mouth in the morning, at noon, and at bedtime.   celecoxib (CELEBREX) 100 MG capsule 1-2 tab daily prn pain   cephALEXin (KEFLEX) 500 MG capsule Take 1 capsule (500 mg total) by mouth 2 (two) times daily.   co-enzyme Q-10 30 MG capsule Take 30 mg by mouth 2 (two) times daily.   diazepam (VALIUM) 5 MG tablet Please take 30-45 minutes prior to procedure. May repeat x 1.   hydrochlorothiazide (MICROZIDE) 12.5 MG capsule hydrochlorothiazide 12.5 mg capsule   L-ARGININE PO Take by mouth daily.   Multiple Vitamins-Minerals (MULTIVITAMIN WITH MINERALS) tablet Take 1 tablet by mouth daily.   mupirocin ointment (BACTROBAN) 2 % Apply 1 Application topically 2 (two) times daily.   nitrofurantoin, macrocrystal-monohydrate, (MACROBID) 100 MG capsule 1 tab po q day after sex.   nitrofurantoin, macrocrystal-monohydrate, (MACROBID) 100 MG capsule Take 1 capsule (100 mg total) by mouth 2 (two) times daily.   NON FORMULARY 2 mushroom supplements twice a day   SRONYX 0.1-20 MG-MCG tablet Take 1 tablet by mouth daily.   SUMAtriptan (IMITREX) 100 MG tablet Take 1 tablet (100 mg total) by mouth every 2 (two) hours as needed for migraine. May repeat in 2  hours if headache persists or recurs.   UNABLE TO FIND CBD OIL   UNABLE TO FIND 4-aminopyradine,Take 1 tablet 5 mg q 12 hours (write this instead of bid).   No facility-administered encounter medications on file as of 11/28/2021.     Objective:   PHYSICAL EXAMINATION:    VITALS:   There were no vitals filed for this visit.   HEENT:  Kenn[place]/AT CV:  Tachy.  Regular Lungs:  CTAB Neck:  No bruits  Neuro:  Orientation:  The patient is alert and oriented x 3.   Cranial nerves: There is good facial symmetry.  No nystagmus is noted.  No square wave jerks.  She has some  overshoot dysmetria.  Speech is fluent and clear but has intermittent pseudobulbar characteristics. Soft palate rises symmetrically and there is no tongue deviation. Hearing is intact to conversational tone. Tone: Tone is good throughout. Sensation: Sensation is intact to light touch x 4 Coordination:  The patient has no difficulty with RAM's or FNF bilaterally. Motor: Strength is 5/5 in the bilateral upper and lower extremities.   Gait and Station: The patient is able to ambulate without difficulty. The patient has mild trouble ambulating in a tandem fashion. The patient is able to stand in the Romberg position.     Chemistry      Component Value Date/Time   NA 138 04/23/2021 1144   K 3.6 04/23/2021 1144   CL 101 04/23/2021 1144   CO2 29 04/23/2021 1144   BUN 10 04/23/2021 1144   CREATININE 0.75 04/23/2021 1144      Component Value Date/Time   CALCIUM 10.2 04/23/2021 1144   ALKPHOS 53 04/23/2021 1144   AST 28 04/23/2021 1144   ALT 24 04/23/2021 1144   BILITOT 0.9 04/23/2021 1144       Total time spent on today's visit was *** minutes, including both face-to-face time and nonface-to-face time.  Time included that spent on review of records (prior notes available to me/labs/imaging if pertinent), discussing treatment and goals, answering patient's questions and coordinating care.  Cc:  Mackie Pai, PA-C

## 2021-11-28 ENCOUNTER — Ambulatory Visit: Payer: BC Managed Care – PPO | Admitting: Neurology

## 2021-11-28 ENCOUNTER — Encounter: Payer: Self-pay | Admitting: Neurology

## 2021-11-28 VITALS — BP 140/87 | HR 111 | Ht 68.0 in | Wt 110.0 lb

## 2021-11-28 DIAGNOSIS — R634 Abnormal weight loss: Secondary | ICD-10-CM

## 2021-11-28 DIAGNOSIS — G118 Other hereditary ataxias: Secondary | ICD-10-CM | POA: Diagnosis not present

## 2021-11-28 DIAGNOSIS — H532 Diplopia: Secondary | ICD-10-CM

## 2021-11-28 DIAGNOSIS — F32A Depression, unspecified: Secondary | ICD-10-CM

## 2021-11-28 DIAGNOSIS — R1319 Other dysphagia: Secondary | ICD-10-CM

## 2021-11-28 MED ORDER — BACLOFEN 10 MG PO TABS: 10.0000 mg | ORAL_TABLET | Freq: Three times a day (TID) | ORAL | 3 refills | Status: AC

## 2021-11-29 ENCOUNTER — Ambulatory Visit: Payer: BC Managed Care – PPO | Admitting: Family Medicine

## 2021-11-29 ENCOUNTER — Encounter: Payer: Self-pay | Admitting: Family Medicine

## 2021-11-29 VITALS — BP 118/62 | Ht 68.0 in | Wt 110.0 lb

## 2021-11-29 DIAGNOSIS — M533 Sacrococcygeal disorders, not elsewhere classified: Secondary | ICD-10-CM

## 2021-11-29 NOTE — Patient Instructions (Signed)
Good to see you Please use heat as needed  Please consider the compression  You can try the exercises   Please send me a message in MyChart with any questions or updates.  Please see me back as needed.   --Dr. Jordan Likes

## 2021-11-29 NOTE — Assessment & Plan Note (Signed)
Doing well since the SI joint injection. She is more mobile and pain is nonexistent.  -Counseled on home exercise therapy and supportive care. -Counseled on compression. - could consider zynex or samsport or piriformis injection

## 2021-11-29 NOTE — Progress Notes (Signed)
  Morgan Day - 36 y.o. female MRN [id number]  Date of birth: 1985/12/28  SUBJECTIVE:  Including CC & ROS.  No chief complaint on file.   Morgan Day is a 36 y.o. female that is  following up for her SI joint pain. Has gotten improvement with the injection. Still notices radicular type pain down the same leg. Pain is mild and intermittent.    Review of Systems See HPI   HISTORY: Past Medical, Surgical, Social, and Family History Reviewed & Updated per EMR.   Pertinent Historical Findings include:  Past Medical History:  Diagnosis Date   Anxiety    DR CYNTHIA WHITE FOR MED   Anxiety    Asthma    exercise asthma   Asthma    Chronic kidney disease AGE 71   REFLUX   Chronic kidney disease    Depression AS  TEEN   Depression    Frequent UTI    Infection    FREQUENT UTI A CHILD   Lumbar radiculopathy 07/01/2021   PONV (postoperative nausea and vomiting)    Urinary reflux    as child    Past Surgical History:  Procedure Laterality Date   HERNIA REPAIR     MOUTH SURGERY     NECK SURGERY     THYROGLOSSAL DUCT CYST  05/09/2011   THYROGLOSSAL DUCT CYST  05/09/2011   Procedure: THYROGLOSSAL DUCT CYST;  Surgeon: Osborn Coho, MD;  Location: [facility] OR;  Service: ENT;  Laterality: N/A;   URINARY SURGERY     for reflux   as child     PHYSICAL EXAM:  VS: BP 118/62   Ht 5\' 8"  (1.727 m)   Wt 110 lb (49.9 kg)   BMI 16.73 kg/m  Physical Exam Gen: NAD, alert, cooperative with exam, well-appearing MSK:  Neurovascularly intact       ASSESSMENT & PLAN:   Sacroiliac joint dysfunction of left side Doing well since the SI joint injection. She is more mobile and pain is nonexistent.  -Counseled on home exercise therapy and supportive care. -Counseled on compression. - could consider zynex or samsport or piriformis injection

## 2021-12-02 ENCOUNTER — Telehealth: Payer: Self-pay

## 2021-12-02 NOTE — Telephone Encounter (Signed)
Fax received from [facility], Patient has an appt schedule for 12/24/21 at 11:45 am

## 2021-12-05 ENCOUNTER — Encounter: Payer: Self-pay | Admitting: Medical

## 2021-12-05 ENCOUNTER — Encounter: Payer: Self-pay | Admitting: Family Medicine

## 2021-12-09 ENCOUNTER — Ambulatory Visit (INDEPENDENT_AMBULATORY_CARE_PROVIDER_SITE_OTHER): Payer: BC Managed Care – PPO | Admitting: Medical

## 2021-12-09 VITALS — BP 128/87 | HR 100 | Resp 18 | Ht 68.0 in | Wt 115.0 lb

## 2021-12-09 DIAGNOSIS — Z87898 Personal history of other specified conditions: Secondary | ICD-10-CM

## 2021-12-09 DIAGNOSIS — R55 Syncope and collapse: Secondary | ICD-10-CM | POA: Diagnosis not present

## 2021-12-09 DIAGNOSIS — G118 Other hereditary ataxias: Secondary | ICD-10-CM

## 2021-12-09 MED ORDER — PREDNISONE 10 MG PO TABS: ORAL_TABLET | ORAL | 0 refills | Status: DC

## 2021-12-09 NOTE — Patient Instructions (Addendum)
Hx of 3 syncopal episodes over the past 15 years.  Most recent event occurred about 5 to 6 months ago per patient.  Sporadic episodes of near syncope as well.  EKG today showed sinus tachycardia rate 106.  CBC and metabolic panel pending today.  Considering sporadic dehydration might be playing a role so stay well-hydrated.  Also your spinal cerebral ataxia may be playing a role.  Do think is best to refer you to cardiology as the last event noted some chest pressure proceeding syncopal event.  You may benefit from Zio patch and echo.  Defer to cardiologist for extended work-up.  Also think is best to refer to neurology for possible EEG to evaluate if seizure is possible.  Follow-up date to be determined after lab review and after reviewing specialist notes.  Also did place another neurology referral for second opinion at your request.

## 2021-12-09 NOTE — Progress Notes (Signed)
Subjective:    Patient ID: Morgan Day, female    DOB: 08-22-1985, 36 y.o.   MRN: [id number]  HPI  Pt in for follow up.  Pt sent me message by my chart. Pt got a syncopal episode 15 years ago  climbing up hanging rock and passed out before she fell. She fell about 30 foot drop. She hit various rocks intermitently and landed in briars.   She got worked up for trauma and concussion.  Then 6-7 years ago she was standing on her porch and had random syncope. She was referred to neurologist but she did not attend appointment. 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   Most recently had 3rd syncopal about 3 months ago. The third time she had some pressure in chest before that event. She passed out for 3-5 minutes.  6 years ago when she passed out on porch husband told patient some of her muscles contact almost like seizure. Did not have any incontince.   Pt does not she has intermittent near syncopal episodes over the years. 2 months had brief tunnel vision/near syncope event before going to sleep..   Over the years when told providers about near syncope events providers advised t stay hydrated.  Prior mri done in 2018.   Pt wants 2nd opinion Dr. Rosalyn Gess   Review of Systems  Constitutional:  Negative for chills, fatigue and fever.  Respiratory:  Negative for cough, choking, shortness of breath and wheezing.   Cardiovascular:  Negative for chest pain and palpitations.  Gastrointestinal:  Negative for abdominal pain, diarrhea and vomiting.  Musculoskeletal:  Negative for back pain, joint swelling and neck stiffness.  Neurological:  Positive for syncope. Negative for dizziness, weakness and headaches.       Positive for syncope in past. None recently except 5-6  months ago.  Hematological:  Negative for adenopathy. Does not bruise/bleed easily.  Psychiatric/Behavioral:   Negative for behavioral problems, confusion and sleep disturbance. The patient is not nervous/anxious.        Objective:   Physical Exam   General Mental Status- Alert. General Appearance- Not in acute distress.   Skin General: Color- Normal Color. Moisture- Normal Moisture.  Neck Carotid Arteries- Normal color. Moisture- Normal Moisture. No carotid bruits. No JVD.  Chest and Lung Exam Auscultation: Breath Sounds:-Normal.  Cardiovascular Auscultation:Rythm- Regular. Murmurs & Other Heart Sounds:Auscultation of the heart reveals- No Murmurs.  Abdomen Inspection:-Inspeection Normal. Palpation/Percussion:Note:No mass. Palpation and Percussion of the abdomen reveal- Non Tender, Non Distended + BS, no rebound or guarding.  Neurologic Cranial Nerve exam:- CN III-XII intact(No nystagmus), symmetric smile.       Assessment & Plan:   Patient Instructions  Hx of 3 syncopal episodes over the past 15 years.  Most recent event occurred about 5 to 6 months ago per patient.  Sporadic episodes of near syncope as well.  EKG today showed sinus tachycardia rate 106.  CBC and metabolic panel pending today.  Considering sporadic dehydration might be playing a role so stay well-hydrated.  Also your spinal cerebral ataxia may be playing a role.  Do think is best to refer you to cardiology as the last event noted some chest pressure proceeding syncopal event.  You may benefit from Zio patch and echo.  Defer to cardiologist for extended work-up.  Also think is best to refer to neurology for possible EEG to  evaluate if seizure is possible.  Follow-up date to be determined after lab review and after reviewing specialist notes.  Also did place another neurology referral for second opinion at your request.   Time spent with patient today was 45  minutes which consisted of chart review, discussing diagnosis, work up treatment and documentation.   Esperanza Richters, PA-C

## 2021-12-10 ENCOUNTER — Other Ambulatory Visit: Payer: Self-pay

## 2021-12-10 DIAGNOSIS — N189 Chronic kidney disease, unspecified: Secondary | ICD-10-CM | POA: Insufficient documentation

## 2021-12-10 DIAGNOSIS — Z9889 Other specified postprocedural states: Secondary | ICD-10-CM | POA: Insufficient documentation

## 2021-12-10 DIAGNOSIS — B999 Unspecified infectious disease: Secondary | ICD-10-CM | POA: Insufficient documentation

## 2021-12-10 DIAGNOSIS — J45909 Unspecified asthma, uncomplicated: Secondary | ICD-10-CM | POA: Insufficient documentation

## 2021-12-10 DIAGNOSIS — N390 Urinary tract infection, site not specified: Secondary | ICD-10-CM | POA: Insufficient documentation

## 2021-12-10 DIAGNOSIS — N1370 Vesicoureteral-reflux, unspecified: Secondary | ICD-10-CM | POA: Insufficient documentation

## 2021-12-10 DIAGNOSIS — F32A Depression, unspecified: Secondary | ICD-10-CM | POA: Insufficient documentation

## 2021-12-10 HISTORY — DX: Chronic kidney disease, unspecified: N18.9

## 2021-12-10 LAB — CBC WITH DIFFERENTIAL/PLATELET
Basophils Absolute: 0.1 10*3/uL (ref 0.0–0.1)
Basophils Relative: 0.6 % (ref 0.0–3.0)
Eosinophils Absolute: 0.1 10*3/uL (ref 0.0–0.7)
Eosinophils Relative: 1.1 % (ref 0.0–5.0)
HCT: 41.1 % (ref 36.0–46.0)
Hemoglobin: 14.5 g/dL (ref 12.0–15.0)
Lymphocytes Relative: 24.3 % (ref 12.0–46.0)
Lymphs Abs: 2.2 10*3/uL (ref 0.7–4.0)
MCHC: 35.3 g/dL (ref 30.0–36.0)
MCV: 93.6 fl (ref 78.0–100.0)
Monocytes Absolute: 0.6 10*3/uL (ref 0.1–1.0)
Monocytes Relative: 6.2 % (ref 3.0–12.0)
Neutro Abs: 6.2 10*3/uL (ref 1.4–7.7)
Neutrophils Relative %: 67.8 % (ref 43.0–77.0)
Platelets: 320 10*3/uL (ref 150.0–400.0)
RBC: 4.39 Mil/uL (ref 3.87–5.11)
RDW: 12.6 % (ref 11.5–15.5)
WBC: 9.1 10*3/uL (ref 4.0–10.5)

## 2021-12-10 LAB — COMPREHENSIVE METABOLIC PANEL
ALT: 16 U/L (ref 0–35)
AST: 18 U/L (ref 0–37)
Albumin: 4.6 g/dL (ref 3.5–5.2)
Alkaline Phosphatase: 51 U/L (ref 39–117)
BUN: 16 mg/dL (ref 6–23)
CO2: 27 mEq/L (ref 19–32)
Calcium: 9.5 mg/dL (ref 8.4–10.5)
Chloride: 102 mEq/L (ref 96–112)
Creatinine, Ser: 0.79 mg/dL (ref 0.40–1.20)
GFR: 95.93 mL/min (ref >60.00)
Glucose, Bld: 103 mg/dL — ABNORMAL HIGH (ref 70–99)
Potassium: 3.5 mEq/L (ref 3.5–5.1)
Sodium: 138 mEq/L (ref 135–145)
Total Bilirubin: 0.5 mg/dL (ref 0.2–1.2)
Total Protein: 6.5 g/dL (ref 6.0–8.3)

## 2021-12-11 ENCOUNTER — Ambulatory Visit: Payer: BC Managed Care – PPO | Attending: Cardiology

## 2021-12-11 ENCOUNTER — Encounter: Payer: Self-pay | Admitting: Cardiology

## 2021-12-11 ENCOUNTER — Ambulatory Visit: Payer: BC Managed Care – PPO | Attending: Cardiology | Admitting: Cardiology

## 2021-12-11 VITALS — BP 124/80 | HR 105 | Ht 68.0 in | Wt 114.0 lb

## 2021-12-11 DIAGNOSIS — R55 Syncope and collapse: Secondary | ICD-10-CM

## 2021-12-11 DIAGNOSIS — R002 Palpitations: Secondary | ICD-10-CM

## 2021-12-11 DIAGNOSIS — G118 Other hereditary ataxias: Secondary | ICD-10-CM

## 2021-12-11 DIAGNOSIS — R011 Cardiac murmur, unspecified: Secondary | ICD-10-CM | POA: Diagnosis not present

## 2021-12-11 HISTORY — DX: Cardiac murmur, unspecified: R01.1

## 2021-12-11 HISTORY — DX: Palpitations: R00.2

## 2021-12-11 NOTE — Patient Instructions (Signed)
Medication Instructions:  Your physician recommends that you continue on your current medications as directed. Please refer to the Current Medication list given to you today.  *If you need a refill on your cardiac medications before your next appointment, please call your pharmacy*   Lab Work: Your physician recommends that you have labs done in the office today. Your test included complete metabolic panel and complete blood count.   [facility] [address] 2nd floor M-W 8-11:30 and 1-4:30 and Thursday and Friday 8-11:30.  If you have labs (blood work) drawn today and your tests are completely normal, you will receive your results only by: MyChart Message (if you have MyChart) OR A paper copy in the mail If you have any lab test that is abnormal or we need to change your treatment, we will call you to review the results.   Testing/Procedures: Your physician has requested that you have an echocardiogram. Echocardiography is a painless test that uses sound waves to create images of your heart. It provides your doctor with information about the size and shape of your heart and how well your heart's chambers and valves are working. This procedure takes approximately one hour. There are no restrictions for this procedure.  Your physician has recommended that you wear an event monitor. Event monitors are medical devices that record the heart's electrical activity. Doctors most often [place] these monitors to diagnose arrhythmias. Arrhythmias are problems with the speed or rhythm of the heartbeat. The monitor is a small, portable device. You can wear one while you do your normal daily activities. This is usually used to diagnose what is causing palpitations/syncope (passing out). This is a 30 day monitor and will be mailed to your home.     Follow-Up: At [facility], you and your health needs are our priority.  As part of our continuing mission to provide you with exceptional heart care, we  have created designated Provider Care Teams.  These Care Teams include your primary Cardiologist (physician) and Advanced Practice Providers (APPs -  Physician Assistants and Nurse Practitioners) who all work together to provide you with the care you need, when you need it.  We recommend signing up for the patient portal called "MyChart".  Sign up information is provided on this After Visit Summary.  MyChart is used to connect with patients for Virtual Visits (Telemedicine).  Patients are able to view lab/test results, encounter notes, upcoming appointments, etc.  Non-urgent messages can be sent to your provider as well.   To learn more about what you can do with MyChart, go to [email or web address].    Your next appointment:   2 [place](s)  The format for your next appointment:   In Person  Provider:   Belva Crome, MD   Other Instructions Echocardiogram An echocardiogram is a test that uses sound waves (ultrasound) to produce images of the heart. Images from an echocardiogram can provide important information about: Heart size and shape. The size and thickness and movement of your heart's walls. Heart muscle function and strength. Heart valve function or if you have stenosis. Stenosis is when the heart valves are too narrow. If blood is flowing backward through the heart valves (regurgitation). A tumor or infectious growth around the heart valves. Areas of heart muscle that are not working well because of poor blood flow or injury from a heart attack. Aneurysm detection. An aneurysm is a weak or damaged part of an artery wall. The wall bulges out from the normal force of blood  pumping through the body. Tell a health care provider about: Any allergies you have. All medicines you are taking, including vitamins, herbs, eye drops, creams, and over-the-counter medicines. Any blood disorders you have. Any surgeries you have had. Any medical conditions you have. Whether you are pregnant  or may be pregnant. What are the risks? Generally, this is a safe test. However, problems may occur, including an allergic reaction to dye (contrast) that may be used during the test. What happens before the test? No specific preparation is needed. You may eat and drink normally. What happens during the test? You will take off your clothes from the waist up and put on a hospital gown. Electrodes or electrocardiogram (ECG)patches may be placed on your chest. The electrodes or patches are then connected to a device that monitors your heart rate and rhythm. You will lie down on a table for an ultrasound exam. A gel will be applied to your chest to help sound waves pass through your skin. A handheld device, called a transducer, will be pressed against your chest and moved over your heart. The transducer produces sound waves that travel to your heart and bounce back (or "echo" back) to the transducer. These sound waves will be captured in real-time and changed into images of your heart that can be viewed on a video monitor. The images will be recorded on a computer and reviewed by your health care provider. You may be asked to change positions or hold your breath for a short time. This makes it easier to get different views or better views of your heart. In some cases, you may receive contrast through an IV in one of your veins. This can improve the quality of the pictures from your heart. The procedure may vary among health care providers and hospitals.   What can I expect after the test? You may return to your normal, everyday life, including diet, activities, and medicines, unless your health care provider tells you not to do that. Follow these instructions at home: It is up to you to get the results of your test. Ask your health care provider, or the department that is doing the test, when your results will be ready. Keep all follow-up visits. This is important. Summary An echocardiogram is a test  that uses sound waves (ultrasound) to produce images of the heart. Images from an echocardiogram can provide important information about the size and shape of your heart, heart muscle function, heart valve function, and other possible heart problems. You do not need to do anything to prepare before this test. You may eat and drink normally. After the echocardiogram is completed, you may return to your normal, everyday life, unless your health care provider tells you not to do that. This information is not intended to replace advice given to you by your health care provider. Make sure you discuss any questions you have with your health care provider. Document Revised: 08/30/2019 Document Reviewed: 08/30/2019 Elsevier Patient Education  2021 [employer].

## 2021-12-11 NOTE — Progress Notes (Signed)
Cardiology Office Note:    Date:  12/11/2021   ID:  Morgan Day, DOB 10-25-1985, MRN [id number]  PCP:  Morgan Richters, PA-C  Cardiologist:  Morgan Brothers, MD   Referring MD: Morgan Richters, PA-C    ASSESSMENT:    1. SCA-3 (spinocerebellar ataxia type 3) (HCC)   2. Syncope, unspecified syncope type   3. Palpitations   4. Cardiac murmur    PLAN:    In order of problems listed above:  Primary prevention stressed with the patient.  Importance of compliance with diet medication stressed and she vocalized understanding. Palpitations and syncope: This is concerning.  It may be related to her neurological condition.  However we will do blood work with TSH to see if there is any issue with her thyroid function.  Will do a 1 [place] monitoring to assess her palpitations.  She is tachycardiac today for unclear reasons.  It might be just the anxiety of coming here.  In view of syncope she was advised not to drive and she vocalized understanding. Cardiac murmur: Echocardiogram will be done to assess murmur heard on auscultation. Patient will be seen in follow-up appointment in 2 months or earlier if the patient has any concerns    Medication Adjustments/Labs and Tests Ordered: Current medicines are reviewed at length with the patient today.  Concerns regarding medicines are outlined above.  No orders of the defined types were placed in this encounter.  No orders of the defined types were placed in this encounter.    History of Present Illness:    Morgan Day is a 36 y.o. female who is being seen today for the evaluation of palpitations and syncope at the request of Morgan Day, Morgan Day, [place].  Patient is a pleasant 36 year old female.  She has history of spinocerebellar ataxia.  She ambulates with a cane.  She mentions to me that she has episodes of syncope.  This has happened 3 times in the past 15 years.  The last time it happened was several months ago.  No orthopnea  or PND.  No chest pain.  She tells me that she gets warning enough to protect herself from getting hurt.  At the time of my evaluation, the patient is alert awake oriented and in no distress.  Past Medical History:  Diagnosis Date   Anxiety    DR Morgan Day FOR MED   Anxiety    Anxiety disorder 12/17/2011   Asthma    Benign hypertension 08/01/2020   Cardiac murmur 12/11/2021   Chronic kidney disease AGE 20   REFLUX   Chronic kidney disease    Chronic kidney disease 12/10/2021   Depression AS  TEEN   Depression    Family hx Ataxia 12/17/2011   Frequent UTI    Generalized anxiety disorder 06/26/2014   History of UTI 06/26/2014   Infection    FREQUENT UTI A CHILD   Knee pain, left 06/26/2014   Lumbar radiculopathy 07/01/2021   Migraine 06/26/2014   PONV (postoperative nausea and vomiting)    Pregnant state, incidental 11/06/2011   Sacroiliac joint dysfunction of left side 10/28/2021   SCA-3 (spinocerebellar ataxia type 3) (HCC) 08/27/2018   Syncope 06/26/2014   Urinary reflux    as child   UTI (urinary tract infection) 07/26/2014   Ventral hernia 06/26/2014   Weight loss 08/27/2018    Past Surgical History:  Procedure Laterality Date   HERNIA REPAIR     MOUTH SURGERY     NECK SURGERY  THYROGLOSSAL DUCT CYST  05/09/2011   THYROGLOSSAL DUCT CYST  05/09/2011   Procedure: THYROGLOSSAL DUCT CYST;  Surgeon: Osborn Coho, MD;  Location: [facility] OR;  Service: ENT;  Laterality: N/A;   URINARY SURGERY     for reflux   as child    Current Medications: Current Meds  Medication Sig   ALPRAZolam (XANAX) 0.5 MG tablet 1 tab po daily as needed  prn severe anxiety   baclofen (LIORESAL) 10 MG tablet Take 1 tablet (10 mg total) by mouth 3 (three) times daily.   Biotin 10 MG TABS Take 10 mg by mouth daily.   Calcium Carb-Cholecalciferol (CALCIUM 500 + D PO) Take 1 tablet by mouth in the morning, at noon, and at bedtime.   celecoxib (CELEBREX) 200 MG capsule Take 200 mg by mouth as  needed for mild pain or moderate pain.   co-enzyme Q-10 30 MG capsule Take 30 mg by mouth daily.   hydrochlorothiazide (MICROZIDE) 12.5 MG capsule Take 12.5 mg by mouth daily.   Multiple Vitamins-Minerals (MULTIVITAMIN WITH MINERALS) tablet Take 1 tablet by mouth every other day.   NON FORMULARY 2 mushroom supplements twice a day   Omega-3 Fatty Acids (FISH OIL) 1000 MG CAPS Take 1,000 mg by mouth daily.   predniSONE (DELTASONE) 10 MG tablet 6 tabs po day 1, 5 tabs po day 2, 4 tabs po day 3, 3 tabs po day 4, 2 tabs po day 5, 1 tab po day 6   SRONYX 0.1-20 MG-MCG tablet Take 1 tablet by mouth daily.   SUMAtriptan (IMITREX) 100 MG tablet Take 1 tablet (100 mg total) by mouth every 2 (two) hours as needed for migraine. May repeat in 2 hours if headache persists or recurs.   UNABLE TO FIND CBD OIL   UNABLE TO FIND 4-aminopyradine,Take 1 tablet 5 mg q 12 hours (write this instead of bid).     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  Tobacco Use   Smoking status: Former    Types: Cigarettes   Smokeless tobacco: Never  Vaping Use   Vaping Use: Never used  Substance and Sexual Activity   Alcohol use: Not Currently    Alcohol/week: 0.0 standard drinks of alcohol    Comment: social   Drug use: Not Currently   Sexual activity: Never    Partners: Male    Birth control/protection: Pill    Comment: D/C'D 06/2011  Other Topics Concern   Not on file  Social History Narrative   Pt lives with at home with Spouse and 2 children, and with father in law   Right handed   Drinks coffee, and tea occasionally, and soda        Social Determinants of Health   Financial Resource Strain: Not on file  Food Insecurity: Not on file  Transportation Needs: Not on file  Physical Activity: Not on file   Stress: Not on file  Social Connections: Not on file     Family History: The patient's family history includes Anxiety disorder in her mother; Ataxia in her mother; Cancer in her mother and sister; Colon cancer in her paternal grandmother; Dementia in her paternal grandmother; Depression in her mother; Hypertension in her father; Other in her brother, maternal grandmother,  mother, sister, and sister.  ROS:   Please see the history of present illness.    All other systems reviewed and are negative.  EKGs/Labs/Other Studies Reviewed:    The following studies were reviewed today: EKG reveals sinus rhythm and nonspecific ST-T changes   Recent Labs: 12/09/2021: ALT 16; BUN 16; Creatinine, Ser 0.79; Hemoglobin 14.5; Platelets 320.0; Potassium 3.5; Sodium 138  Recent Lipid Panel No results found for: "CHOL", "TRIG", "HDL", "CHOLHDL", "VLDL", "LDLCALC", "LDLDIRECT"  Physical Exam:    VS:  BP 124/80   Pulse (!) 105   Ht 5\' 8"  (1.727 m)   Wt 114 lb (51.7 kg)   SpO2 98%   BMI 17.33 kg/m     Wt Readings from Last 3 Encounters:  12/11/21 114 lb (51.7 kg)  12/09/21 115 lb (52.2 kg)  11/29/21 110 lb (49.9 kg)     GEN: Patient is in no acute distress HEENT: Normal NECK: No JVD; No carotid bruits LYMPHATICS: No lymphadenopathy CARDIAC: S1 S2 regular, 2/6 systolic murmur at the apex. RESPIRATORY:  Clear to auscultation without rales, wheezing or rhonchi  ABDOMEN: Soft, non-tender, non-distended MUSCULOSKELETAL:  No edema; No deformity  SKIN: Warm and dry NEUROLOGIC:  Alert and oriented x 3 PSYCHIATRIC:  Normal affect    Signed, 13/10/23, MD  12/11/2021 10:30 AM    [facility]

## 2021-12-12 LAB — CBC
Hematocrit: 43.3 % (ref 34.0–46.6)
Hemoglobin: 14.8 g/dL (ref 11.1–15.9)
MCH: 33 pg (ref 26.6–33.0)
MCHC: 34.2 g/dL (ref 31.5–35.7)
MCV: 97 fL (ref 79–97)
Platelets: 308 10*3/uL (ref 150–450)
RBC: 4.48 x10E6/uL (ref 3.77–5.28)
RDW: 11.9 % (ref 11.7–15.4)
WBC: 9.9 10*3/uL (ref 3.4–10.8)

## 2021-12-12 LAB — COMPREHENSIVE METABOLIC PANEL
ALT: 14 IU/L (ref 0–32)
AST: 16 IU/L (ref 0–40)
Albumin/Globulin Ratio: 2.6 — ABNORMAL HIGH (ref 1.2–2.2)
Albumin: 4.9 g/dL (ref 3.9–4.9)
Alkaline Phosphatase: 65 IU/L (ref 44–121)
BUN/Creatinine Ratio: 16 (ref 9–23)
BUN: 12 mg/dL (ref 6–20)
Bilirubin Total: 0.5 mg/dL (ref 0.0–1.2)
CO2: 24 mmol/L (ref 20–29)
Calcium: 10.1 mg/dL (ref 8.7–10.2)
Chloride: 103 mmol/L (ref 96–106)
Creatinine, Ser: 0.73 mg/dL (ref 0.57–1.00)
Globulin, Total: 1.9 g/dL (ref 1.5–4.5)
Glucose: 92 mg/dL (ref 70–99)
Potassium: 3.7 mmol/L (ref 3.5–5.2)
Sodium: 143 mmol/L (ref 134–144)
Total Protein: 6.8 g/dL (ref 6.0–8.5)
eGFR: 109 mL/min/{1.73_m2} (ref >59)

## 2021-12-16 ENCOUNTER — Encounter: Payer: Self-pay | Admitting: Cardiology

## 2021-12-27 ENCOUNTER — Ambulatory Visit (INDEPENDENT_AMBULATORY_CARE_PROVIDER_SITE_OTHER): Payer: BC Managed Care – PPO | Admitting: Psychologist

## 2021-12-27 DIAGNOSIS — Z634 Disappearance and death of family member: Secondary | ICD-10-CM | POA: Diagnosis not present

## 2021-12-27 DIAGNOSIS — F330 Major depressive disorder, recurrent, mild: Secondary | ICD-10-CM

## 2021-12-27 NOTE — Progress Notes (Signed)
                Stevenson Windmiller, PsyD 
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
## 2022-01-06 ENCOUNTER — Ambulatory Visit (HOSPITAL_COMMUNITY): Payer: BC Managed Care – PPO | Attending: Cardiology

## 2022-01-06 DIAGNOSIS — R011 Cardiac murmur, unspecified: Secondary | ICD-10-CM | POA: Insufficient documentation

## 2022-01-06 LAB — ECHOCARDIOGRAM COMPLETE
Area-P 1/2: 7.74 cm2
S' Lateral: 3 cm

## 2022-01-09 ENCOUNTER — Telehealth: Payer: Self-pay | Admitting: *Deleted

## 2022-01-09 ENCOUNTER — Ambulatory Visit: Payer: BC Managed Care – PPO | Attending: Cardiology

## 2022-01-09 DIAGNOSIS — R55 Syncope and collapse: Secondary | ICD-10-CM

## 2022-01-09 DIAGNOSIS — R002 Palpitations: Secondary | ICD-10-CM

## 2022-01-09 NOTE — Telephone Encounter (Signed)
Reached out to pt to inquire about 30 day event monitor which was ordered on 12/11/21 to be mailed to pt. She stated had never received it. I let her know I would call [employer] about this.  Spoke with rep at monitor company who stated the monitor had been cancelled because they were unable to reach the pt. I confirmed that this was a oversight on their part and that they should have reached out to [place] also. He agreed and reinstated the monitor order. I will reach out to pt and get her to message [place] once she receives monitor so that we can reschedule her office visit with Dr. Tomie China so that the monitor results will be back.

## 2022-01-14 DIAGNOSIS — R002 Palpitations: Secondary | ICD-10-CM

## 2022-01-14 DIAGNOSIS — R55 Syncope and collapse: Secondary | ICD-10-CM

## 2022-01-23 ENCOUNTER — Encounter: Payer: Self-pay | Admitting: Family Medicine

## 2022-01-29 DIAGNOSIS — G118 Other hereditary ataxias: Secondary | ICD-10-CM | POA: Diagnosis not present

## 2022-02-02 ENCOUNTER — Other Ambulatory Visit: Payer: Self-pay | Admitting: Family Medicine

## 2022-02-03 ENCOUNTER — Encounter: Payer: Self-pay | Admitting: Family Medicine

## 2022-02-03 ENCOUNTER — Ambulatory Visit: Payer: BC Managed Care – PPO | Admitting: Family Medicine

## 2022-02-03 ENCOUNTER — Ambulatory Visit: Payer: Self-pay

## 2022-02-03 ENCOUNTER — Ambulatory Visit: Payer: BC Managed Care – PPO | Admitting: Psychologist

## 2022-02-03 VITALS — BP 112/70 | Ht 68.0 in | Wt 115.0 lb

## 2022-02-03 DIAGNOSIS — M76899 Other specified enthesopathies of unspecified lower limb, excluding foot: Secondary | ICD-10-CM

## 2022-02-03 HISTORY — DX: Other specified enthesopathies of unspecified lower limb, excluding foot: M76.899

## 2022-02-03 MED ORDER — METHYLPREDNISOLONE ACETATE 40 MG/ML IJ SUSP
40.0000 mg | Freq: Once | INTRAMUSCULAR | Status: AC
  Administered 2022-02-03: 40 mg via INTRAMUSCULAR

## 2022-02-03 NOTE — Progress Notes (Signed)
Morgan Day - 37 y.o. female MRN [id number]  Date of birth: July 16, 1985  SUBJECTIVE:  Including CC & ROS.  No chief complaint on file.   Morgan Day is a 37 y.o. female that is presenting with acute pain at the left proximal hamstring.  Pain is worse with bending over or getting up from a seated position.  Localized to this 1 area.    Review of Systems Day HPI   HISTORY: Past Medical, Surgical, Social, and Family History Reviewed & Updated per EMR.   Pertinent Historical Findings include:  Past Medical History:  Diagnosis Date   Anxiety    DR CYNTHIA WHITE FOR MED   Anxiety    Anxiety disorder 12/17/2011   Asthma    Benign hypertension 08/01/2020   Cardiac murmur 12/11/2021   Chronic kidney disease AGE 30   REFLUX   Chronic kidney disease    Chronic kidney disease 12/10/2021   Depression AS  TEEN   Depression    Family hx Ataxia 12/17/2011   Frequent UTI    Generalized anxiety disorder 06/26/2014   History of UTI 06/26/2014   Infection    FREQUENT UTI A CHILD   Knee pain, left 06/26/2014   Lumbar radiculopathy 07/01/2021   Migraine 06/26/2014   PONV (postoperative nausea and vomiting)    Pregnant state, incidental 11/06/2011   Sacroiliac joint dysfunction of left side 10/28/2021   SCA-3 (spinocerebellar ataxia type 3) ([facility]) 08/27/2018   Syncope 06/26/2014   Urinary reflux    as child   UTI (urinary tract infection) 07/26/2014   Ventral hernia 06/26/2014   Weight loss 08/27/2018    Past Surgical History:  Procedure Laterality Date   HERNIA REPAIR     MOUTH SURGERY     NECK SURGERY     THYROGLOSSAL DUCT CYST  05/09/2011   THYROGLOSSAL DUCT CYST  05/09/2011   Procedure: THYROGLOSSAL DUCT CYST;  Surgeon: Jerrell Belfast, MD;  Location: [facility] OR;  Service: ENT;  Laterality: N/A;   URINARY SURGERY     for reflux   as child     PHYSICAL EXAM:  VS: BP 112/70   Ht 5\' 8"  (1.727 m)   Wt 115 lb (52.2 kg)   BMI 17.49 kg/m  Physical Exam Gen:  NAD, alert, cooperative with exam, well-appearing MSK:  Neurovascularly intact    Limited ultrasound: Left hamstring pain:  Thickening of the proximal hamstring appreciated with hyperemia near the origin.  Summary: Findings consistent with tendinitis of the proximal hamstring tendon  Ultrasound and interpretation by Clearance Coots, MD  Aspiration/Injection Procedure Note Gloristine Turrubiates Drach 1985/05/01  Procedure: Injection Indications: left hamstring pain  Procedure Details Consent: Risks of procedure as well as the alternatives and risks of each were explained to the (patient/caregiver).  Consent for procedure obtained. Time Out: Verified patient identification, verified procedure, site/side was marked, verified correct patient position, special equipment/implants available, medications/allergies/relevent history reviewed, required imaging and test results available.  Performed.  The area was cleaned with iodine and alcohol swabs.    The left proximal hamstring origin was injected using 1 cc's of 40 mg Depo-Medrol and 4 cc's of 0.25% bupivacaine was injected.  Ultrasound was used. Images were obtained in long views showing the injection.     A sterile dressing was applied.  Patient did tolerate procedure well.     ASSESSMENT & PLAN:   Hamstring tendinitis at origin Acutely occurring with findings on ultrasound to suggest the tendon being the issue. -Counseled on  home exercise therapy and supportive care. -Injection today. -Could consider shockwave therapy or physical therapy

## 2022-02-03 NOTE — Patient Instructions (Signed)
Good to see you Please try heat  Please try the exercises   Please send me a message in MyChart with any questions or updates.  Please see me back in 4 weeks or as needed if better.   --Dr. Raeford Razor

## 2022-02-03 NOTE — Assessment & Plan Note (Signed)
Acutely occurring with findings on ultrasound to suggest the tendon being the issue. -Counseled on home exercise therapy and supportive care. -Injection today. -Could consider shockwave therapy or physical therapy

## 2022-02-05 ENCOUNTER — Other Ambulatory Visit: Payer: Self-pay

## 2022-02-10 ENCOUNTER — Ambulatory Visit (INDEPENDENT_AMBULATORY_CARE_PROVIDER_SITE_OTHER): Payer: BC Managed Care – PPO | Admitting: Psychologist

## 2022-02-10 DIAGNOSIS — Z634 Disappearance and death of family member: Secondary | ICD-10-CM | POA: Diagnosis not present

## 2022-02-10 DIAGNOSIS — F330 Major depressive disorder, recurrent, mild: Secondary | ICD-10-CM | POA: Diagnosis not present

## 2022-02-10 NOTE — Progress Notes (Signed)
[facility] Counselor/Therapist Progress Note  Patient ID: Morgan Day, MRN: [id number],    Date: 02/10/2022  Time Spent: 10:03 am to 10:42 am; total time: 39 minutes   This session was held via in person. The patient consented to in-person therapy and was in the clinician's office. Limits of confidentiality were discussed with the patient.    Treatment Type: Individual Therapy  Reported Symptoms: Sadness  Mental Status Exam: Appearance:  Well Groomed     Behavior: Appropriate  Motor: Normal  Speech/Language:  Clear and Coherent  Affect: Appropriate  Mood: normal  Thought process: normal  Thought content:   WNL  Sensory/Perceptual disturbances:   WNL  Orientation: oriented to person, place, and time/date  Attention: Good  Concentration: Good  Memory: WNL  Fund of knowledge:  Good  Insight:   Good  Judgment:  Good  Impulse Control: Good   Risk Assessment: Danger to Self:  No Self-injurious Behavior: No Danger to Others: No Duty to Warn:no Physical Aggression / Violence:No  Access to Firearms a concern: No  Gang Involvement:No   Subjective: Beginning the session, patient described herself as okay. After reviewing the treatment plan patient talked about the holidays. From there, she talked about how she has found a possibility for a new neurologist. She then talked about some of the challenges that she experiences living with a serious illness daily. She talked about wanting to be present for her kids. She processed thoughts and emotions. She was agreeable to following up. She denied suicidal and homicidal ideation.    Interventions:  Worked on developing a therapeutic relationship with the patient using active listening and reflective statements. Provided emotional support using empathy and validation. Reviewed the treatment plan with the patient. Reviewed events since the intake. Normalized and validated thoughts and emotions. Processed the  holidays. Identified goals for the session. Explored some of the challenges that patient experienced related to finding a new neurologist. Used socratic questions to assist the patient. Processed thoughts and emotions. Challenged some of the thoughts. Explored relationship dynamics with family members. Validated patient's experience. Provided empathic statements. Assessed for suicidal and homicidal ideation.   Homework: NA  Next Session: Emotional support and process how to be present for children  Diagnosis: F33.0 major depressive affective disorder, recurrent, mild and B71.6 uncomplicated bereavement.   Plan:   Goals Work through the grieving process and face reality of own death Accept emotional support from others around them Live life to the fullest, event though time may be limited Become as knowledgeable about the medical condition  Reduce fear, anxiety about the health condition  Accept the illness Accept the role of psychological and behavioral factors  Stabilize anxiety level wile increasing ability to function Learn and implement coping skills that result in a reduction of anxiety  Alleviate depressive symptoms Recognize, accept, and cope with depressive feelings Develop healthy thinking patterns Develop healthy interpersonal relationships  Objectives target date for all objectives is 12/28/2022 Identify feelings associated with the illness Family members share with each other feelings Identify the losses or limitations that have been experienced Verbalize acceptance of the reality of the medical condition Commit to learning and implement a proactive approach to managing personal stresses Verbalize an understanding of the medical condition Work with therapist to develop a plan for coping with stress Learn and implement skills for managing stress Engage in social, productive activities that are possible Engage in faith based activities implement positive imagery Identify  coping skills and sources of emotional  support Patient's partner and family members verbalize their fears regarding severity of health condition Identify sources of emotional distress  Learning and implement calming skills to reduce overall anxiety Learn and implement problem solving strategies Identify and engage in pleasant activities Learning and implement personal and interpersonal skills to reduce anxiety and improve interpersonal relationships Learn to accept limitations in life and commit to tolerating, rather than avoiding, unpleasant emotions while accomplishing meaningful goals Identify major life conflicts from the past and present that form the basis for present anxiety Learn and implement behavioral strategies Verbalize an understanding and resolution of current interpersonal problems Learn and implement problem solving and decision making skills Learn and implement conflict resolution skills to resolve interpersonal problems Verbalize an understanding of healthy and unhealthy emotions verbalize insight into how past relationships may be influence current experiences with depression Use mindfulness and acceptance strategies and increase value based behavior  Increase hopeful statements about the future.   Interventions Teach about stress and ways to handle stress Assist the patient in developing a coping action plan for stressors Conduct skills based training for coping strategies Train problem focused skills Sort out what activities the individual can do Encourage patient to rely upon his/her spiritual faith Teach the patient to use guided imagery Probe and evaluate family's ability to provide emotional support Allow family to share their fears Assist the patient in identifying, sorting through, and verbalizing the various feelings generated by his/her medical condition Meet with family members  Ask patient list out limitations  Use stress inoculation training  Use  Acceptance and Commitment Therapy to help client accept uncomfortable realities in order to accomplish value-consistent goals Reinforce the client's insight into the role of his/her past emotional pain and present anxiety  Discuss examples demonstrating that unrealistic worry overestimates the probability of threats and underestimate patient's ability  Assist the patient in analyzing his or her worries Help patient understand that avoidance is reinforcing  Behavioral activation help the client explore the relationship, nature of the dispute,  Help the client develop new interpersonal skills and relationships Conduct Problem so living therapy Teach conflict resolution skills Use a process-experiential approach Conduct TLDP Conduct ACT  Goals Begin a healthy grieving process Objectives target date for all objectives is 12/28/2022.  Tell in detail the story of the current loss that is triggering symptoms Read books on the topic of grief Watch videos on the theme of grief Begin verbalizing feelings associated with the loss Attend a grief support group express thoughts and feelings about the deceased Identify and voice positives about the deceased implement acts of spiritual faith  Interventions create a safe environment and actively build trust use empathy, compassion, and support ask the patient to write a letter to the lost person conduct empty chair ask the patient to discuss and list the positives and negative aspects of the person encourage patient to rely upon his/her spiritual faith  ask client to read books on grief ask patient to watch videos about grief assist patient in identifying emotions  ask patient to attend support group   The patient and clinician reviewed the treatment plan on 02/10/2022. The patient approved of the treatment plan.   Conception Chancy, PsyD

## 2022-02-10 NOTE — Progress Notes (Signed)
                Adonias Demore, PsyD 

## 2022-02-11 ENCOUNTER — Encounter: Payer: Self-pay | Admitting: Cardiology

## 2022-02-11 ENCOUNTER — Ambulatory Visit: Payer: BC Managed Care – PPO | Attending: Cardiology | Admitting: Cardiology

## 2022-02-11 VITALS — BP 106/70 | HR 92 | Ht 68.0 in | Wt 112.1 lb

## 2022-02-11 DIAGNOSIS — G118 Other hereditary ataxias: Secondary | ICD-10-CM | POA: Diagnosis not present

## 2022-02-11 DIAGNOSIS — R002 Palpitations: Secondary | ICD-10-CM | POA: Diagnosis not present

## 2022-02-11 DIAGNOSIS — I10 Essential (primary) hypertension: Secondary | ICD-10-CM

## 2022-02-11 NOTE — Patient Instructions (Signed)
Medication Instructions:  Your physician recommends that you continue on your current medications as directed. Please refer to the Current Medication list given to you today.  *If you need a refill on your cardiac medications before your next appointment, please call your pharmacy*   Lab Work: None ordered If you have labs (blood work) drawn today and your tests are completely normal, you will receive your results only by: MyChart Message (if you have MyChart) OR A paper copy in the mail If you have any lab test that is abnormal or we need to change your treatment, we will call you to review the results.   Testing/Procedures: None ordered   Follow-Up: At [facility], you and your health needs are our priority.  As part of our continuing mission to provide you with exceptional heart care, we have created designated Provider Care Teams.  These Care Teams include your primary Cardiologist (physician) and Advanced Practice Providers (APPs -  Physician Assistants and Nurse Practitioners) who all work together to provide you with the care you need, when you need it.  We recommend signing up for the patient portal called "MyChart".  Sign up information is provided on this After Visit Summary.  MyChart is used to connect with patients for Virtual Visits (Telemedicine).  Patients are able to view lab/test results, encounter notes, upcoming appointments, etc.  Non-urgent messages can be sent to your provider as well.   To learn more about what you can do with MyChart, go to [email or web address].    Your next appointment:   12 Rana(s)  The format for your next appointment:   In Person  Provider:   Rajan Revankar, MD    Other Instructions none  Important Information About Sugar      

## 2022-02-11 NOTE — Progress Notes (Signed)
Cardiology Office Note:    Date:  02/11/2022   ID:  Morgan Day, DOB [birth year], MRN [id number]  PCP:  Esperanza Richters, PA-C  Cardiologist:  Garwin Brothers, MD   Referring MD: Esperanza Richters, PA-C    ASSESSMENT:    1. Benign hypertension   2. Palpitations   3. SCA-3 (spinocerebellar ataxia type 3) (HCC)    PLAN:    In order of problems listed above:  Primary prevention stressed with the patient.  Importance of compliance with diet medication stressed and she vocalized understanding. Essential hypertension: Blood pressure stable and diet was emphasized.  Blood work is followed by primary care.  She is on diuretic therapy. Palpitations: She is wearing the monitor now.  She had issues and did not keep previous appointments.  Will review the monitor and get back to her when it is ready. Cardiac murmur: Echocardiogram was unremarkable and I discussed with the patient at length.  Questions were answered to her satisfaction. Patient will be seen in follow-up appointment in 12 months or earlier if the patient has any concerns    Medication Adjustments/Labs and Tests Ordered: Current medicines are reviewed at length with the patient today.  Concerns regarding medicines are outlined above.  No orders of the defined types were placed in this encounter.  No orders of the defined types were placed in this encounter.    No chief complaint on file.    History of Present Illness:    Morgan Day is a 37 y.o. female.  Patient has past medical history of essential hypertension, palpitations.  She denies any problems at this time and takes care of activities of daily living.  No chest pain orthopnea or PND.  At the time of my evaluation, the patient is alert awake oriented and in no distress.  Past Medical History:  Diagnosis Date   Anxiety disorder 12/17/2011   Asthma    Benign hypertension 08/01/2020   Cardiac murmur 12/11/2021   Chronic kidney disease AGE 45    REFLUX   Chronic kidney disease    Chronic kidney disease 12/10/2021   Depression AS  TEEN   Depression    Family hx Ataxia 12/17/2011   Frequent UTI    Generalized anxiety disorder 06/26/2014   Hamstring tendinitis at origin 02/03/2022   History of UTI 06/26/2014   Infection    FREQUENT UTI A CHILD   Knee pain, left 06/26/2014   Lumbar radiculopathy 07/01/2021   Migraine 06/26/2014   Palpitations 12/11/2021   PONV (postoperative nausea and vomiting)    Pregnant state, incidental 11/06/2011   Sacroiliac joint dysfunction of left side 10/28/2021   SCA-3 (spinocerebellar ataxia type 3) (HCC) 08/27/2018   Syncope 06/26/2014   Urinary reflux    as child   UTI (urinary tract infection) 07/26/2014   Ventral hernia 06/26/2014   Weight loss 08/27/2018    Past Surgical History:  Procedure Laterality Date   HERNIA REPAIR     MOUTH SURGERY     NECK SURGERY     THYROGLOSSAL DUCT CYST  05/09/2011   THYROGLOSSAL DUCT CYST  05/09/2011   Procedure: THYROGLOSSAL DUCT CYST;  Surgeon: Osborn Coho, MD;  Location: [facility] OR;  Service: ENT;  Laterality: N/A;   URINARY SURGERY     for reflux   as child    Current Medications: Current Meds  Medication Sig   ALPRAZolam (XANAX) 0.5 MG tablet 1 tab po daily as needed  prn severe anxiety   baclofen (LIORESAL)  10 MG tablet Take 1 tablet (10 mg total) by mouth 3 (three) times daily.   Biotin 10 MG TABS Take 10 mg by mouth daily.   Calcium Carb-Cholecalciferol (CALCIUM 500 + D PO) Take 1 tablet by mouth in the morning, at noon, and at bedtime.   celecoxib (CELEBREX) 200 MG capsule TAKE 1 TO 2 TABLETS BY MOUTH DAILY AS NEEDED FOR PAIN.   co-enzyme Q-10 30 MG capsule Take 30 mg by mouth daily.   hydrochlorothiazide (MICROZIDE) 12.5 MG capsule Take 12.5 mg by mouth daily.   Multiple Vitamins-Minerals (MULTIVITAMIN WITH MINERALS) tablet Take 1 tablet by mouth every other day.   NON FORMULARY 2 mushroom supplements twice a day   Omega-3 Fatty Acids  (FISH OIL) 1000 MG CAPS Take 1,000 mg by mouth daily.   SRONYX 0.1-20 MG-MCG tablet Take 1 tablet by mouth daily.   SUMAtriptan (IMITREX) 100 MG tablet Take 1 tablet (100 mg total) by mouth every 2 (two) hours as needed for migraine. May repeat in 2 hours if headache persists or recurs.   UNABLE TO FIND CBD OIL   UNABLE TO FIND 4-aminopyradine,Take 1 tablet 5 mg q 12 hours (write this instead of bid).     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  Tobacco Use   Smoking status: Former    Types: Cigarettes   Smokeless tobacco: Never  Vaping Use   Vaping Use: Never used  Substance and Sexual Activity   Alcohol use: Not Currently    Alcohol/week: 0.0 standard drinks of alcohol    Comment: social   Drug use: Not Currently   Sexual activity: Never    Partners: Male    Birth control/protection: Pill    Comment: D/C'D 06/2011  Other Topics Concern   Not on file  Social History Narrative   Pt lives with at home with Spouse and 2 children, and with father in law   Right handed   Drinks coffee, and tea occasionally, and soda        Social Determinants of Health   Financial Resource Strain: Not on file  Food Insecurity: Not on file  Transportation Needs: Not on file  Physical Activity: Not on file  Stress: Not on file  Social Connections: Not on file     Family History: The patient's family history includes Anxiety disorder in her mother; Ataxia in her mother; Cancer in her mother and sister; Colon cancer in her paternal grandmother; Dementia in her paternal grandmother; Depression in her mother; Hypertension in her father; Other in her brother, maternal grandmother, mother, sister, and sister.  ROS:   Please see the history of present illness.    All other systems reviewed  and are negative.  EKGs/Labs/Other Studies Reviewed:    The following studies were reviewed today: I discussed report of the echo with the patient at length   Recent Labs: 12/11/2021: ALT 14; BUN 12; Creatinine, Ser 0.73; Hemoglobin 14.8; Platelets 308; Potassium 3.7; Sodium 143  Recent Lipid Panel No results found for: "CHOL", "TRIG", "HDL", "CHOLHDL", "VLDL", "LDLCALC", "LDLDIRECT"  Physical Exam:    VS:  BP 106/70   Pulse 92   Ht 5\' 8"  (1.727 m)   Wt 112 lb 1.3 oz (50.8 kg)  SpO2 99%   BMI 17.04 kg/m     Wt Readings from Last 3 Encounters:  02/11/22 112 lb 1.3 oz (50.8 kg)  02/03/22 115 lb (52.2 kg)  12/11/21 114 lb (51.7 kg)     GEN: Patient is in no acute distress HEENT: Normal NECK: No JVD; No carotid bruits LYMPHATICS: No lymphadenopathy CARDIAC: Hear sounds regular, 2/6 systolic murmur at the apex. RESPIRATORY:  Clear to auscultation without rales, wheezing or rhonchi  ABDOMEN: Soft, non-tender, non-distended MUSCULOSKELETAL:  No edema; No deformity  SKIN: Warm and dry NEUROLOGIC:  Alert and oriented x 3 PSYCHIATRIC:  Normal affect   Signed, Jenean Lindau, MD  02/11/2022 10:35 AM    [place]

## 2022-02-13 ENCOUNTER — Encounter: Payer: Self-pay | Admitting: Cardiology

## 2022-02-14 ENCOUNTER — Other Ambulatory Visit: Payer: Self-pay | Admitting: Cardiology

## 2022-02-14 DIAGNOSIS — R002 Palpitations: Secondary | ICD-10-CM

## 2022-02-14 DIAGNOSIS — R55 Syncope and collapse: Secondary | ICD-10-CM

## 2022-03-02 ENCOUNTER — Other Ambulatory Visit: Payer: Self-pay | Admitting: Medical

## 2022-03-03 ENCOUNTER — Other Ambulatory Visit: Payer: Self-pay

## 2022-03-03 MED ORDER — ALPRAZOLAM 0.5 MG PO TABS: ORAL_TABLET | ORAL | 0 refills | Status: AC

## 2022-03-03 NOTE — Telephone Encounter (Signed)
Requesting: xanax Contract:04/23/21 UDS:04/23/21 Last Visit:12/09/21 Next Visit:n/a Last Refill:04/23/21  Please Advise

## 2022-03-03 NOTE — Telephone Encounter (Signed)
Rx refill sent to pharmacy. 

## 2022-03-10 ENCOUNTER — Ambulatory Visit: Payer: BC Managed Care – PPO | Admitting: Psychologist

## 2022-03-10 DIAGNOSIS — Z634 Disappearance and death of family member: Secondary | ICD-10-CM

## 2022-03-10 DIAGNOSIS — F330 Major depressive disorder, recurrent, mild: Secondary | ICD-10-CM

## 2022-03-10 NOTE — Progress Notes (Signed)
[facility] Counselor/Therapist Progress Note  Patient ID: Morgan Day, MRN: [id number],    Date: 03/10/2022  Time Spent: 10:10 am to 10:48 am; total time: 38 minutes   This session was held via in person. The patient consented to in-person therapy and was in the clinician's office. Limits of confidentiality were discussed with the patient.    Treatment Type: Individual Therapy  Reported Symptoms: Anxiety over possible pregnancy  Mental Status Exam: Appearance:  Well Groomed     Behavior: Appropriate  Motor: Normal  Speech/Language:  Clear and Coherent  Affect: Appropriate  Mood: normal  Thought process: normal  Thought content:   WNL  Sensory/Perceptual disturbances:   WNL  Orientation: oriented to person, place, and time/date  Attention: Good  Concentration: Good  Memory: WNL  Fund of knowledge:  Good  Insight:   Good  Judgment:  Good  Impulse Control: Good   Risk Assessment: Danger to Self:  No Self-injurious Behavior: No Danger to Others: No Duty to Warn:no Physical Aggression / Violence:No  Access to Firearms a concern: No  Gang Involvement:No   Subjective: Beginning the session, patient described herself as doing well while reviewing the session. Patient spent the session reflecting and processing concerns that she might be pregnant. She was agreeable to following up. She denied suicidal and homicidal ideation.    Interventions:  Worked on developing a therapeutic relationship with the patient using active listening and reflective statements. Provided emotional support using empathy and validation. Reviewed the treatment plan with the patient. Used summary statements. Praised the patient for doing well and explored what has assisted the patient. Identified goals for the session. Normalized and validated concerns. Used socratic questions to assist the patient. Challenged some of the thoughts expressed. Provided psychoeducation about  confirmation bias. Processed thoughts and emotions. Provided empathic statements. Assessed for suicidal and homicidal ideation.   Homework: NA  Next Session: Emotional support  Diagnosis: F33.0 major depressive affective disorder, recurrent, mild and 123XX123 uncomplicated bereavement.   Plan:   Goals Work through the grieving process and face reality of own death Accept emotional support from others around them Live life to the fullest, event though time may be limited Become as knowledgeable about the medical condition  Reduce fear, anxiety about the health condition  Accept the illness Accept the role of psychological and behavioral factors  Stabilize anxiety level wile increasing ability to function Learn and implement coping skills that result in a reduction of anxiety  Alleviate depressive symptoms Recognize, accept, and cope with depressive feelings Develop healthy thinking patterns Develop healthy interpersonal relationships  Objectives target date for all objectives is 12/28/2022 Identify feelings associated with the illness Family members share with each other feelings Identify the losses or limitations that have been experienced Verbalize acceptance of the reality of the medical condition Commit to learning and implement a proactive approach to managing personal stresses Verbalize an understanding of the medical condition Work with therapist to develop a plan for coping with stress Learn and implement skills for managing stress Engage in social, productive activities that are possible Engage in faith based activities implement positive imagery Identify coping skills and sources of emotional support Patient's partner and family members verbalize their fears regarding severity of health condition Identify sources of emotional distress  Learning and implement calming skills to reduce overall anxiety Learn and implement problem solving strategies Identify and engage in  pleasant activities Learning and implement personal and interpersonal skills to reduce anxiety and improve interpersonal relationships  Learn to accept limitations in life and commit to tolerating, rather than avoiding, unpleasant emotions while accomplishing meaningful goals Identify major life conflicts from the past and present that form the basis for present anxiety Learn and implement behavioral strategies Verbalize an understanding and resolution of current interpersonal problems Learn and implement problem solving and decision making skills Learn and implement conflict resolution skills to resolve interpersonal problems Verbalize an understanding of healthy and unhealthy emotions verbalize insight into how past relationships may be influence current experiences with depression Use mindfulness and acceptance strategies and increase value based behavior  Increase hopeful statements about the future.   Interventions Teach about stress and ways to handle stress Assist the patient in developing a coping action plan for stressors Conduct skills based training for coping strategies Train problem focused skills Sort out what activities the individual can do Encourage patient to rely upon his/her spiritual faith Teach the patient to use guided imagery Probe and evaluate family's ability to provide emotional support Allow family to share their fears Assist the patient in identifying, sorting through, and verbalizing the various feelings generated by his/her medical condition Meet with family members  Ask patient list out limitations  Use stress inoculation training  Use Acceptance and Commitment Therapy to help client accept uncomfortable realities in order to accomplish value-consistent goals Reinforce the client's insight into the role of his/her past emotional pain and present anxiety  Discuss examples demonstrating that unrealistic worry overestimates the probability of threats and  underestimate patient's ability  Assist the patient in analyzing his or her worries Help patient understand that avoidance is reinforcing  Behavioral activation help the client explore the relationship, nature of the dispute,  Help the client develop new interpersonal skills and relationships Conduct Problem so living therapy Teach conflict resolution skills Use a process-experiential approach Conduct TLDP Conduct ACT  Goals Begin a healthy grieving process Objectives target date for all objectives is 12/28/2022.  Tell in detail the story of the current loss that is triggering symptoms Read books on the topic of grief Watch videos on the theme of grief Begin verbalizing feelings associated with the loss Attend a grief support group express thoughts and feelings about the deceased Identify and voice positives about the deceased implement acts of spiritual faith  Interventions create a safe environment and actively build trust use empathy, compassion, and support ask the patient to write a letter to the lost person conduct empty chair ask the patient to discuss and list the positives and negative aspects of the person encourage patient to rely upon his/her spiritual faith  ask client to read books on grief ask patient to watch videos about grief assist patient in identifying emotions  ask patient to attend support group   The patient and clinician reviewed the treatment plan on 02/10/2022. The patient approved of the treatment plan.   Conception Chancy, PsyD

## 2022-04-02 ENCOUNTER — Ambulatory Visit: Payer: BC Managed Care – PPO | Admitting: Psychologist

## 2022-04-02 DIAGNOSIS — F330 Major depressive disorder, recurrent, mild: Secondary | ICD-10-CM | POA: Diagnosis not present

## 2022-04-02 DIAGNOSIS — Z634 Disappearance and death of family member: Secondary | ICD-10-CM

## 2022-04-02 NOTE — Progress Notes (Signed)
[place] Counselor/Therapist Progress Note  Patient ID: Morgan Day, MRN: [id number],    Date: 04/02/2022  Time Spent: 10:07 am to 10:45 am; total time: 38 minutes   This session was held via in person. The patient consented to in-person therapy and was in the clinician's office. Limits of confidentiality were discussed with the patient.    Treatment Type: Individual Therapy  Reported Symptoms: Less distress  Mental Status Exam: Appearance:  Well Groomed     Behavior: Appropriate  Motor: Normal  Speech/Language:  Clear and Coherent  Affect: Appropriate  Mood: normal  Thought process: normal  Thought content:   WNL  Sensory/Perceptual disturbances:   WNL  Orientation: oriented to person, place, and time/date  Attention: Good  Concentration: Good  Memory: WNL  Fund of knowledge:  Good  Insight:   Good  Judgment:  Good  Impulse Control: Good   Risk Assessment: Danger to Self:  No Self-injurious Behavior: No Danger to Others: No Duty to Warn:no Physical Aggression / Violence:No  Access to Firearms a concern: No  Gang Involvement:No   Subjective: Beginning the session, patient described herself as doing well disclosing that she is not pregnant. She voiced that she has mixed feelings about not being pregnant. From there, she primarily talked about carrying a burden regarding her father in-law that she feels as though she can't share with her husband. She processed thoughts and emotions regarding this situation. She also stated understanding regarding the transition. She was agreeable to following up. She denied suicidal and homicidal ideation.    Interventions:  Worked on developing a therapeutic relationship with the patient using active listening and reflective statements. Provided emotional support using empathy and validation. Reviewed the treatment plan with the patient. Reviewed events since the last session. Praised the patient for doing well.  Reflected on emotions related to not being pregnant. Identified goals for the session. Explored the etiology of conflict that patient experiences with father in-law. Processed thoughts and emotions. Explored ways so that patient did not have to carry the burden. Validated experience. Disclosed to patient about the upcoming transition. Answered questions of the patient. Discussed next steps. Provided empathic statements. Assessed for suicidal and homicidal ideation.   Homework: NA  Next Session: Emotional support  Diagnosis: F33.0 major depressive affective disorder, recurrent, mild and 123XX123 uncomplicated bereavement.   Plan:   Goals Work through the grieving process and face reality of own death Accept emotional support from others around them Live life to the fullest, event though time may be limited Become as knowledgeable about the medical condition  Reduce fear, anxiety about the health condition  Accept the illness Accept the role of psychological and behavioral factors  Stabilize anxiety level wile increasing ability to function Learn and implement coping skills that result in a reduction of anxiety  Alleviate depressive symptoms Recognize, accept, and cope with depressive feelings Develop healthy thinking patterns Develop healthy interpersonal relationships  Objectives target date for all objectives is 12/28/2022 Identify feelings associated with the illness Family members share with each other feelings Identify the losses or limitations that have been experienced Verbalize acceptance of the reality of the medical condition Commit to learning and implement a proactive approach to managing personal stresses Verbalize an understanding of the medical condition Work with therapist to develop a plan for coping with stress Learn and implement skills for managing stress Engage in social, productive activities that are possible Engage in faith based activities implement positive  imagery Identify coping skills  and sources of emotional support Patient's partner and family members verbalize their fears regarding severity of health condition Identify sources of emotional distress  Learning and implement calming skills to reduce overall anxiety Learn and implement problem solving strategies Identify and engage in pleasant activities Learning and implement personal and interpersonal skills to reduce anxiety and improve interpersonal relationships Learn to accept limitations in life and commit to tolerating, rather than avoiding, unpleasant emotions while accomplishing meaningful goals Identify major life conflicts from the past and present that form the basis for present anxiety Learn and implement behavioral strategies Verbalize an understanding and resolution of current interpersonal problems Learn and implement problem solving and decision making skills Learn and implement conflict resolution skills to resolve interpersonal problems Verbalize an understanding of healthy and unhealthy emotions verbalize insight into how past relationships may be influence current experiences with depression Use mindfulness and acceptance strategies and increase value based behavior  Increase hopeful statements about the future.   Interventions Teach about stress and ways to handle stress Assist the patient in developing a coping action plan for stressors Conduct skills based training for coping strategies Train problem focused skills Sort out what activities the individual can do Encourage patient to rely upon his/her spiritual faith Teach the patient to use guided imagery Probe and evaluate family's ability to provide emotional support Allow family to share their fears Assist the patient in identifying, sorting through, and verbalizing the various feelings generated by his/her medical condition Meet with family members  Ask patient list out limitations  Use stress inoculation  training  Use Acceptance and Commitment Therapy to help client accept uncomfortable realities in order to accomplish value-consistent goals Reinforce the client's insight into the role of his/her past emotional pain and present anxiety  Discuss examples demonstrating that unrealistic worry overestimates the probability of threats and underestimate patient's ability  Assist the patient in analyzing his or her worries Help patient understand that avoidance is reinforcing  Behavioral activation help the client explore the relationship, nature of the dispute,  Help the client develop new interpersonal skills and relationships Conduct Problem so living therapy Teach conflict resolution skills Use a process-experiential approach Conduct TLDP Conduct ACT  Goals Begin a healthy grieving process Objectives target date for all objectives is 12/28/2022.  Tell in detail the story of the current loss that is triggering symptoms Read books on the topic of grief Watch videos on the theme of grief Begin verbalizing feelings associated with the loss Attend a grief support group express thoughts and feelings about the deceased Identify and voice positives about the deceased implement acts of spiritual faith  Interventions create a safe environment and actively build trust use empathy, compassion, and support ask the patient to write a letter to the lost person conduct empty chair ask the patient to discuss and list the positives and negative aspects of the person encourage patient to rely upon his/her spiritual faith  ask client to read books on grief ask patient to watch videos about grief assist patient in identifying emotions  ask patient to attend support group   The patient and clinician reviewed the treatment plan on 02/10/2022. The patient approved of the treatment plan.   Conception Chancy, PsyD

## 2022-04-21 ENCOUNTER — Ambulatory Visit: Payer: BC Managed Care – PPO | Admitting: Psychologist

## 2022-05-01 ENCOUNTER — Encounter: Payer: Self-pay | Admitting: Family Medicine

## 2022-05-05 ENCOUNTER — Encounter: Payer: Self-pay | Admitting: *Deleted

## 2022-05-14 ENCOUNTER — Encounter: Payer: Self-pay | Admitting: Family Medicine

## 2022-05-14 ENCOUNTER — Other Ambulatory Visit: Payer: Self-pay

## 2022-05-14 ENCOUNTER — Ambulatory Visit: Payer: BC Managed Care – PPO | Admitting: Family Medicine

## 2022-05-14 VITALS — BP 118/78 | Ht 68.0 in | Wt 112.0 lb

## 2022-05-14 DIAGNOSIS — M76899 Other specified enthesopathies of unspecified lower limb, excluding foot: Secondary | ICD-10-CM

## 2022-05-14 MED ORDER — METHYLPREDNISOLONE ACETATE 40 MG/ML IJ SUSP
40.0000 mg | Freq: Once | INTRAMUSCULAR | Status: AC
  Administered 2022-05-14: 40 mg via INTRA_ARTICULAR

## 2022-05-14 NOTE — Progress Notes (Signed)
Morgan Day - 37 y.o. female MRN [id number]  Date of birth: August 30, 1985  SUBJECTIVE:  Including CC & ROS.  No chief complaint on file.   Morgan Day is a 37 y.o. female that is presenting with worsening left ischial tuberosity type pain.  This is similar to the pain she experienced a few months ago.  She is having trouble sitting.  She having pain that goes down the leg as well.    Review of Systems See HPI   HISTORY: Past Medical, Surgical, Social, and Family History Reviewed & Updated per EMR.   Pertinent Historical Findings include:  Past Medical History:  Diagnosis Date   Anxiety disorder 12/17/2011   Asthma    Benign hypertension 08/01/2020   Cardiac murmur 12/11/2021   Chronic kidney disease AGE 59   REFLUX   Chronic kidney disease    Chronic kidney disease 12/10/2021   Depression AS  TEEN   Depression    Family hx Ataxia 12/17/2011   Frequent UTI    Generalized anxiety disorder 06/26/2014   Hamstring tendinitis at origin 02/03/2022   History of UTI 06/26/2014   Infection    FREQUENT UTI A CHILD   Knee pain, left 06/26/2014   Lumbar radiculopathy 07/01/2021   Migraine 06/26/2014   Palpitations 12/11/2021   PONV (postoperative nausea and vomiting)    Pregnant state, incidental 11/06/2011   Sacroiliac joint dysfunction of left side 10/28/2021   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/27/2018   Syncope 06/26/2014   Urinary reflux    as child   UTI (urinary tract infection) 07/26/2014   Ventral hernia 06/26/2014   Weight loss 08/27/2018    Past Surgical History:  Procedure Laterality Date   HERNIA REPAIR     MOUTH SURGERY     NECK SURGERY     THYROGLOSSAL DUCT CYST  05/09/2011   THYROGLOSSAL DUCT CYST  05/09/2011   Procedure: THYROGLOSSAL DUCT CYST;  Surgeon: Osborn Coho, MD;  Location: [facility] OR;  Service: ENT;  Laterality: N/A;   URINARY SURGERY     for reflux   as child     PHYSICAL EXAM:  VS: BP 118/78 (BP Location: Left Arm,  Patient Position: Sitting)   Ht  (1.727 m)   Wt 112 lb (50.8 kg)   BMI 17.03 kg/m  Physical Exam Gen: NAD, alert, cooperative with exam, well-appearing MSK:  Neurovascularly intact    Limited ultrasound: Left proximal hamstring pain:  Irregularity appreciated at the origin of the hamstring tendon. There is increased hyperemia near the origin  Summary: Findings consistent with tendinitis  Ultrasound and interpretation by Clare Gandy, MD   Aspiration/Injection Procedure Note Morgan Day 04-Jan-1986  Procedure: Injection Indications: Left hamstring pain with  Procedure Details Consent: Risks of procedure as well as the alternatives and risks of each were explained to the (patient/caregiver).  Consent for procedure obtained. Time Out: Verified patient identification, verified procedure, site/side was marked, verified correct patient position, special equipment/implants available, medications/allergies/relevent history reviewed, required imaging and test results available.  Performed.  The area was cleaned with iodine and alcohol swabs.    The left proximal hamstring origin was injected using 1 cc of 40 mg Depo-Medrol and 4 cc of 0.25% bupivacaine on a 25-gauge 1-1/2 inch needle.  Ultrasound was used. Images were obtained in short views showing the injection.     A sterile dressing was applied.  Patient did tolerate procedure well.     ASSESSMENT & PLAN:   Hamstring  tendinitis at origin Acutely occurring.  She does have recurrent irritation at the proximal hamstring. -Counseled on home exercise therapy and supportive care. -Injection today. -Consider further imaging or physical therapy

## 2022-05-14 NOTE — Assessment & Plan Note (Signed)
Acutely occurring.  She does have recurrent irritation at the proximal hamstring. -Counseled on home exercise therapy and supportive care. -Injection today. -Consider further imaging or physical therapy

## 2022-05-14 NOTE — Patient Instructions (Signed)
Good to see you Please use heat as needed  You could always check out Dr. Benjamin Stain at the [facility] medcenter Please send me a message in MyChart with any questions or updates.  Please see me back as needed.   --Dr. Jordan Likes

## 2022-05-23 ENCOUNTER — Ambulatory Visit: Payer: BC Managed Care – PPO | Admitting: Family Medicine

## 2022-05-28 ENCOUNTER — Encounter: Payer: Self-pay | Admitting: Family Medicine

## 2022-05-28 ENCOUNTER — Other Ambulatory Visit: Payer: Self-pay

## 2022-05-28 ENCOUNTER — Ambulatory Visit: Payer: BC Managed Care – PPO | Admitting: Family Medicine

## 2022-05-28 VITALS — BP 108/72 | Ht 68.0 in | Wt 112.0 lb

## 2022-05-28 DIAGNOSIS — M533 Sacrococcygeal disorders, not elsewhere classified: Secondary | ICD-10-CM

## 2022-05-28 MED ORDER — TRIAMCINOLONE ACETONIDE 40 MG/ML IJ SUSP
40.0000 mg | Freq: Once | INTRAMUSCULAR | Status: AC
Start: 2022-05-28 — End: 2022-05-28
  Administered 2022-05-28: 40 mg via INTRA_ARTICULAR

## 2022-05-28 NOTE — Addendum Note (Signed)
Addended by: Merrilyn Puma on: 05/28/2022 10:50 AM   Modules accepted: Orders

## 2022-05-28 NOTE — Progress Notes (Signed)
Morgan Day - 37 y.o. female MRN [id number]  Date of birth: 09/11/1985  SUBJECTIVE:  Including CC & ROS.  No chief complaint on file.   Morgan Day is a 37 y.o. female that is presenting with acute on chronic left SI joint pain.  This is similar to her previous type pain.  It is localized to this area.  It is worse with certain movements.  No injury or inciting event.    Review of Systems See HPI   HISTORY: Past Medical, Surgical, Social, and Family History Reviewed & Updated per EMR.   Pertinent Historical Findings include:  Past Medical History:  Diagnosis Date   Anxiety disorder 12/17/2011   Asthma    Benign hypertension 08/01/2020   Cardiac murmur 12/11/2021   Chronic kidney disease AGE 6   REFLUX   Chronic kidney disease    Chronic kidney disease 12/10/2021   Depression AS  TEEN   Depression    Family hx Ataxia 12/17/2011   Frequent UTI    Generalized anxiety disorder 06/26/2014   Hamstring tendinitis at origin 02/03/2022   History of UTI 06/26/2014   Infection    FREQUENT UTI A CHILD   Knee pain, left 06/26/2014   Lumbar radiculopathy 07/01/2021   Migraine 06/26/2014   Palpitations 12/11/2021   PONV (postoperative nausea and vomiting)    Pregnant state, incidental 11/06/2011   Sacroiliac joint dysfunction of left side 10/28/2021   SCA-3 (spinocerebellar ataxia type 3) (HCC) 08/27/2018   Syncope 06/26/2014   Urinary reflux    as child   UTI (urinary tract infection) 07/26/2014   Ventral hernia 06/26/2014   Weight loss 08/27/2018    Past Surgical History:  Procedure Laterality Date   HERNIA REPAIR     MOUTH SURGERY     NECK SURGERY     THYROGLOSSAL DUCT CYST  05/09/2011   THYROGLOSSAL DUCT CYST  05/09/2011   Procedure: THYROGLOSSAL DUCT CYST;  Surgeon: Osborn Coho, MD;  Location: [facility] OR;  Service: ENT;  Laterality: N/A;   URINARY SURGERY     for reflux   as child     PHYSICAL EXAM:  VS: BP 108/72 (BP Location: Left Arm,  Patient Position: Sitting)   Ht 5\' 8"  (1.727 m)   Wt 112 lb (50.8 kg)   BMI 17.03 kg/m  Physical Exam Gen: NAD, alert, cooperative with exam, well-appearing MSK:  Neurovascularly intact     Aspiration/Injection Procedure Note Morgan Day January 22, 1985  Procedure: Injection Indications: Left SI joint pain  Procedure Details Consent: Risks of procedure as well as the alternatives and risks of each were explained to the (patient/caregiver).  Consent for procedure obtained. Time Out: Verified patient identification, verified procedure, site/side was marked, verified correct patient position, special equipment/implants available, medications/allergies/relevent history reviewed, required imaging and test results available.  Performed.  The area was cleaned with iodine and alcohol swabs.    The left SI joint was injected using 4 cc of 1% lidocaine and 0.4 cc of 8.4% sodium carbonate on a 22-gauge 3-1/2 inch needle.  The syringe was switched to mixture containing 1 cc's of 40 mg Kenalog and 4 cc's of 0.25% bupivacaine was injected.  Ultrasound was used. Images were obtained in long views showing the injection.     A sterile dressing was applied.  Patient did tolerate procedure well.     ASSESSMENT & PLAN:   Sacroiliac joint dysfunction of left side Acutely occurring.  It is exacerbation of her underlying previous SI  joint pain. -Counseled on home exercise therapy and supportive care. -Injection today. -Could consider right SI joint injection.

## 2022-05-28 NOTE — Assessment & Plan Note (Signed)
Acutely occurring.  It is exacerbation of her underlying previous SI joint pain. -Counseled on home exercise therapy and supportive care. -Injection today. -Could consider right SI joint injection.

## 2022-05-28 NOTE — Patient Instructions (Signed)
Good to see you Please alternate heat and ice   Please send me a message in MyChart with any questions or updates.  Please see me back as needed.   --Dr. Chanc Kervin  

## 2022-05-30 DIAGNOSIS — G118 Other hereditary ataxias: Secondary | ICD-10-CM | POA: Diagnosis not present

## 2022-06-15 IMAGING — DX DG THORACIC SPINE 2V
3 series · 3 of 3 positions shown · non-contrast
Comparison: Two-view chest radiograph 09/15/2017

CLINICAL DATA: Lower third thoracic pain

EXAM:
THORACIC SPINE 2 VIEWS

[t-spine lat]
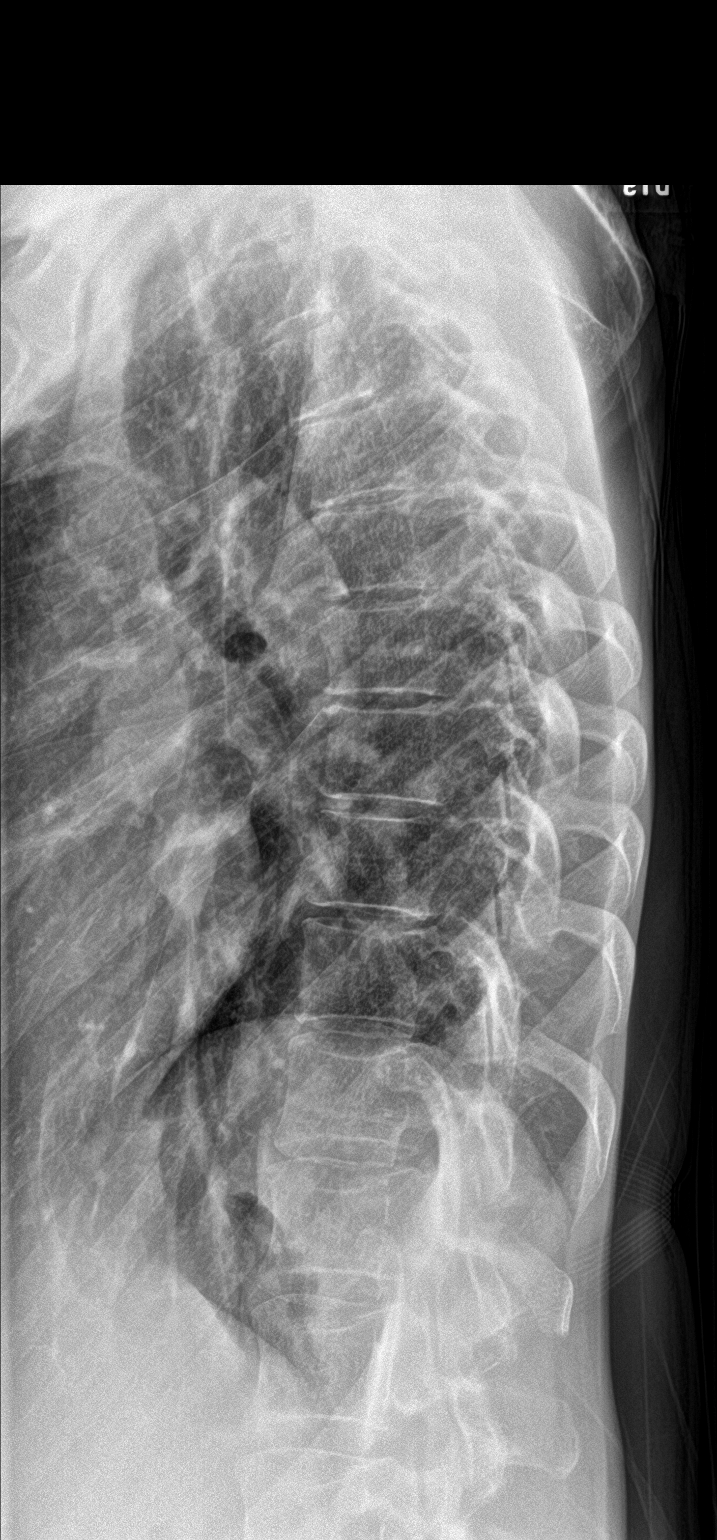

[t-spine swimmers]
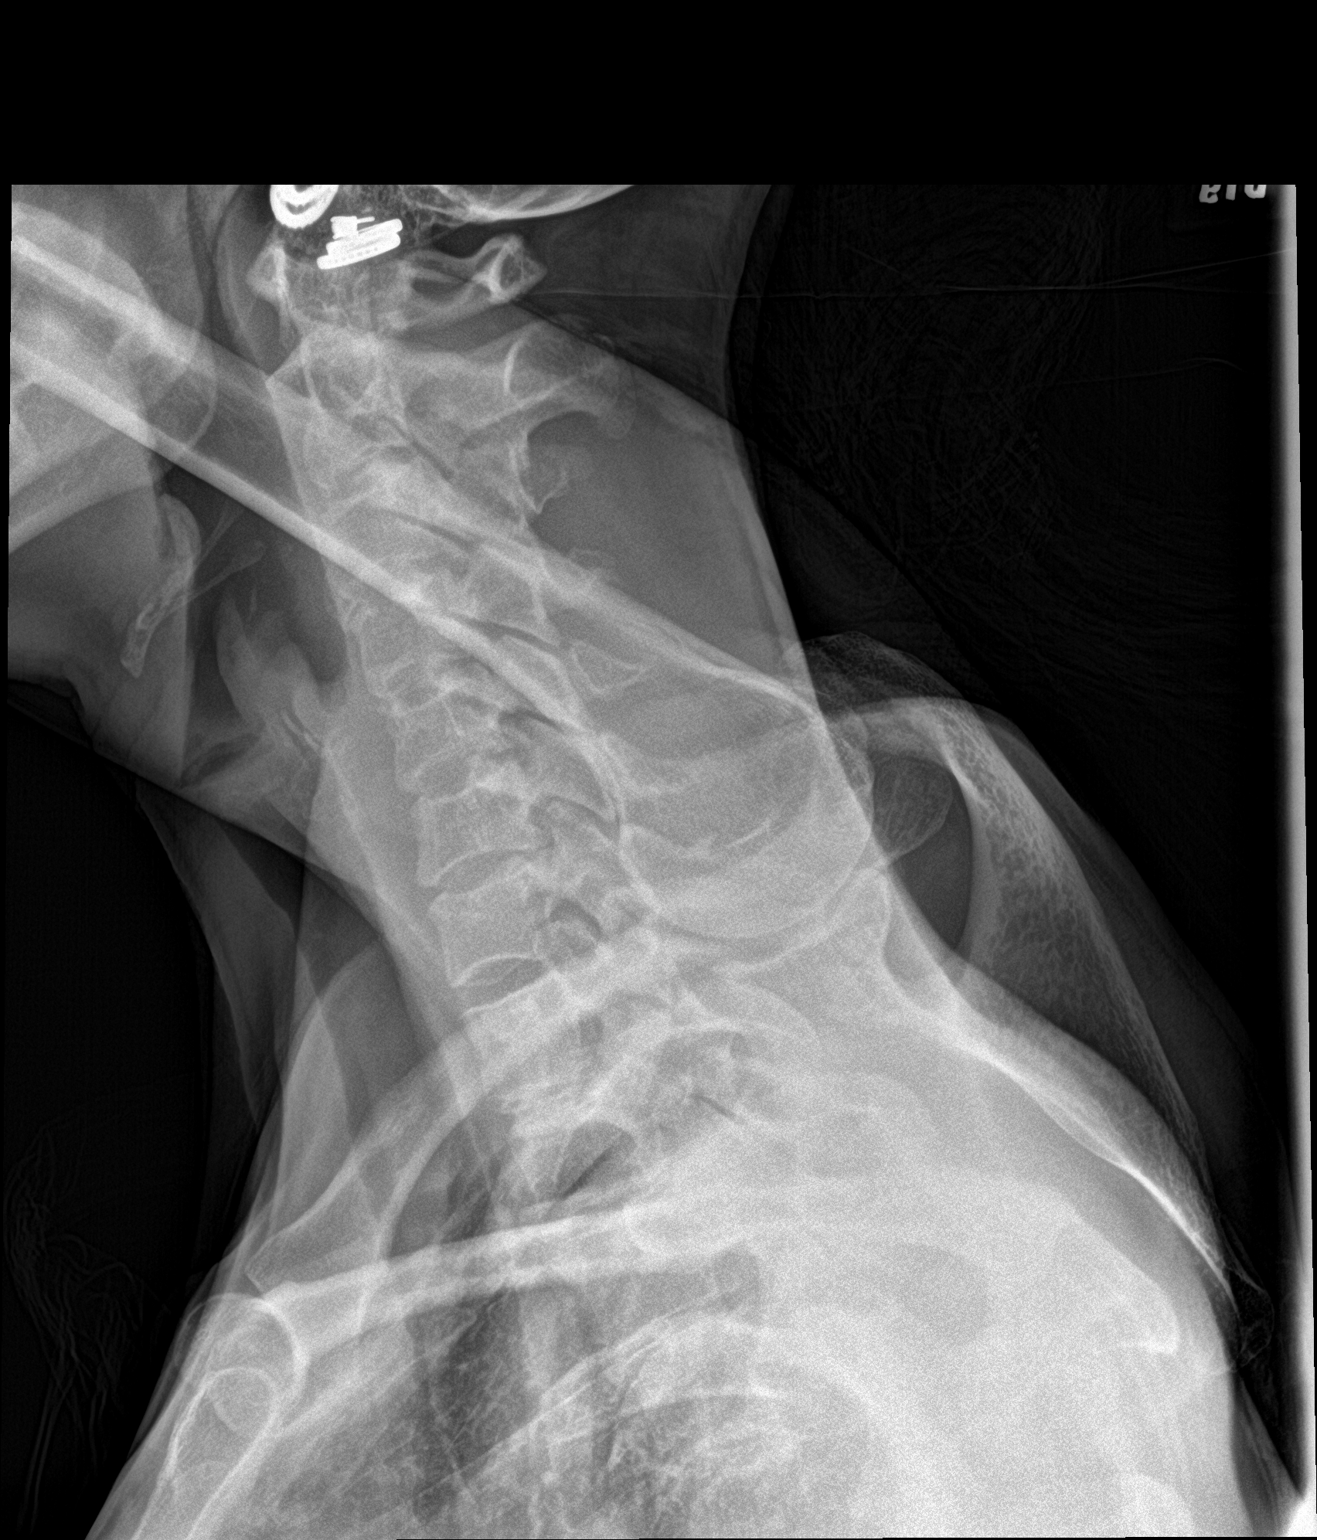

[t-spine ap]
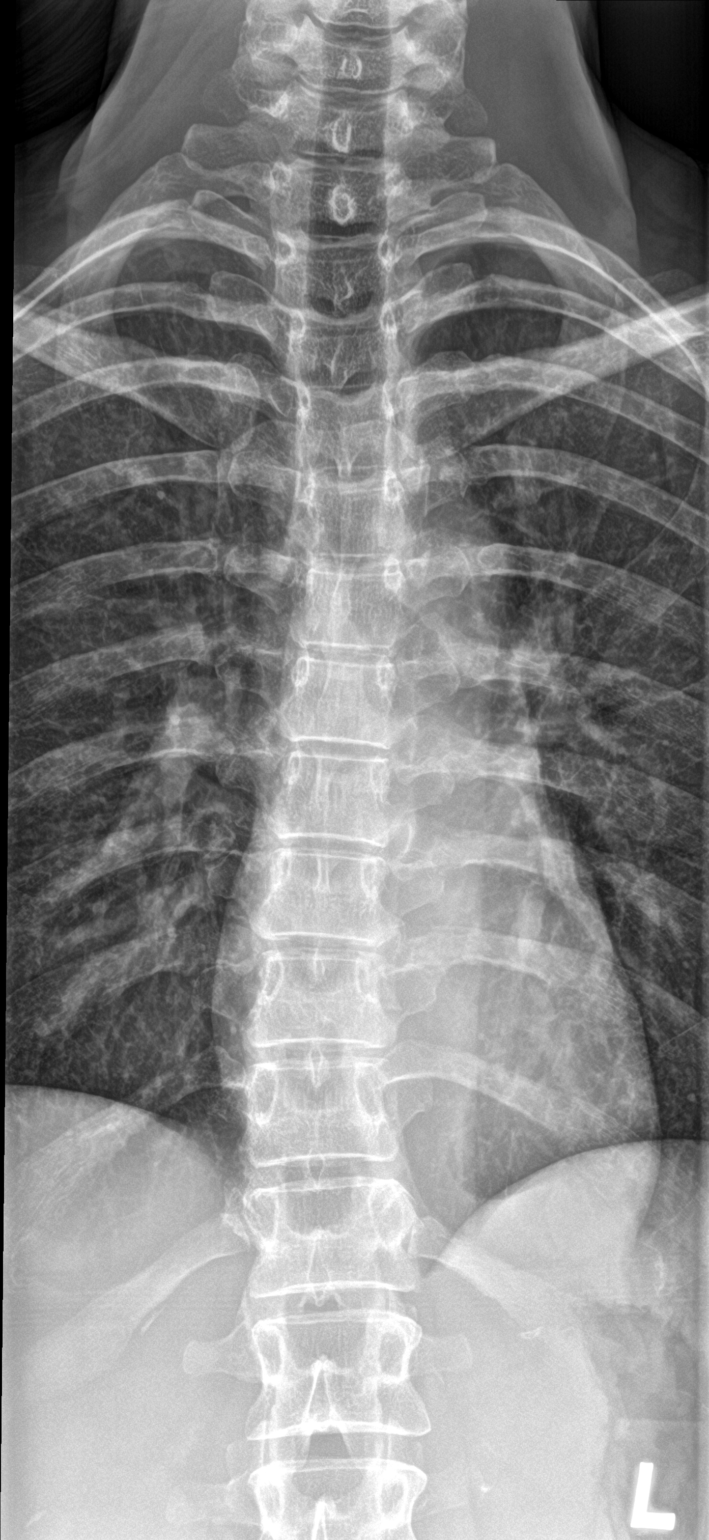

[3 of 3 positions shown; findings below may reference images not displayed]

FINDINGS: Vertebral body heights are preserved, without evidence of acute
injury. There is mild dextrocurvature centered at T10. Sagittal
alignment is normal, with no antero or retrolisthesis. The disc
spaces are preserved. There is no significant degenerative change.

The imaged heart and lungs are unremarkable.
IMPRESSION: Mild Dextrocurvature centered at T10. Otherwise, unremarkable
thoracic spine radiographs.

## 2022-06-17 ENCOUNTER — Encounter: Payer: Self-pay | Admitting: Neurology

## 2022-06-17 ENCOUNTER — Telehealth: Payer: Self-pay | Admitting: Neurology

## 2022-06-17 NOTE — Telephone Encounter (Signed)
Pts sister, Luster Landsberg, came in and spoke to [place].  Said that pt was dissatisified with her care here and had transferred care elsewhere.  Eber Jones asked if we could assist her in getting new neurology but had already established.  We will go ahead and send discharge letter so that her process of being removed from our clinic is complete.

## 2022-06-23 DIAGNOSIS — G111 Early-onset cerebellar ataxia, unspecified: Secondary | ICD-10-CM | POA: Diagnosis not present

## 2022-06-23 DIAGNOSIS — Z131 Encounter for screening for diabetes mellitus: Secondary | ICD-10-CM | POA: Diagnosis not present

## 2022-06-23 DIAGNOSIS — Z13228 Encounter for screening for other metabolic disorders: Secondary | ICD-10-CM | POA: Diagnosis not present

## 2022-06-23 DIAGNOSIS — I10 Essential (primary) hypertension: Secondary | ICD-10-CM | POA: Diagnosis not present

## 2022-06-23 DIAGNOSIS — Z1329 Encounter for screening for other suspected endocrine disorder: Secondary | ICD-10-CM | POA: Diagnosis not present

## 2022-06-23 DIAGNOSIS — Z01411 Encounter for gynecological examination (general) (routine) with abnormal findings: Secondary | ICD-10-CM | POA: Diagnosis not present

## 2022-08-21 DIAGNOSIS — G118 Other hereditary ataxias: Secondary | ICD-10-CM | POA: Diagnosis not present

## 2022-09-10 DIAGNOSIS — M533 Sacrococcygeal disorders, not elsewhere classified: Secondary | ICD-10-CM | POA: Diagnosis not present

## 2022-09-10 DIAGNOSIS — M545 Low back pain, unspecified: Secondary | ICD-10-CM | POA: Diagnosis not present

## 2022-11-13 DIAGNOSIS — M533 Sacrococcygeal disorders, not elsewhere classified: Secondary | ICD-10-CM | POA: Diagnosis not present

## 2022-11-27 ENCOUNTER — Ambulatory Visit: Payer: BC Managed Care – PPO | Admitting: Neurology

## 2022-12-01 DIAGNOSIS — M25552 Pain in left hip: Secondary | ICD-10-CM | POA: Diagnosis not present

## 2022-12-22 DIAGNOSIS — M533 Sacrococcygeal disorders, not elsewhere classified: Secondary | ICD-10-CM | POA: Diagnosis not present

## 2022-12-29 DIAGNOSIS — G118 Other hereditary ataxias: Secondary | ICD-10-CM | POA: Diagnosis not present

## 2022-12-30 DIAGNOSIS — S76312D Strain of muscle, fascia and tendon of the posterior muscle group at thigh level, left thigh, subsequent encounter: Secondary | ICD-10-CM | POA: Diagnosis not present

## 2022-12-30 DIAGNOSIS — M533 Sacrococcygeal disorders, not elsewhere classified: Secondary | ICD-10-CM | POA: Diagnosis not present

## 2023-02-05 DIAGNOSIS — S76312A Strain of muscle, fascia and tendon of the posterior muscle group at thigh level, left thigh, initial encounter: Secondary | ICD-10-CM | POA: Diagnosis not present

## 2023-04-13 DIAGNOSIS — G118 Other hereditary ataxias: Secondary | ICD-10-CM | POA: Diagnosis not present

## 2023-04-13 DIAGNOSIS — Z1331 Encounter for screening for depression: Secondary | ICD-10-CM | POA: Diagnosis not present

## 2023-04-14 DIAGNOSIS — S76812A Strain of other specified muscles, fascia and tendons at thigh level, left thigh, initial encounter: Secondary | ICD-10-CM | POA: Diagnosis not present

## 2023-04-14 DIAGNOSIS — I10 Essential (primary) hypertension: Secondary | ICD-10-CM | POA: Diagnosis not present

## 2023-04-14 DIAGNOSIS — X58XXXA Exposure to other specified factors, initial encounter: Secondary | ICD-10-CM | POA: Diagnosis not present

## 2023-04-14 DIAGNOSIS — Z87891 Personal history of nicotine dependence: Secondary | ICD-10-CM | POA: Diagnosis not present

## 2023-04-14 DIAGNOSIS — G1119 Other early-onset cerebellar ataxia: Secondary | ICD-10-CM | POA: Diagnosis not present

## 2023-04-14 DIAGNOSIS — J45990 Exercise induced bronchospasm: Secondary | ICD-10-CM | POA: Diagnosis not present

## 2023-04-14 DIAGNOSIS — Z01818 Encounter for other preprocedural examination: Secondary | ICD-10-CM | POA: Diagnosis not present

## 2023-04-22 DIAGNOSIS — J45909 Unspecified asthma, uncomplicated: Secondary | ICD-10-CM | POA: Diagnosis not present

## 2023-04-22 DIAGNOSIS — S76312A Strain of muscle, fascia and tendon of the posterior muscle group at thigh level, left thigh, initial encounter: Secondary | ICD-10-CM | POA: Diagnosis not present

## 2023-04-22 DIAGNOSIS — I129 Hypertensive chronic kidney disease with stage 1 through stage 4 chronic kidney disease, or unspecified chronic kidney disease: Secondary | ICD-10-CM | POA: Diagnosis not present

## 2023-04-22 DIAGNOSIS — N189 Chronic kidney disease, unspecified: Secondary | ICD-10-CM | POA: Diagnosis not present

## 2023-04-22 DIAGNOSIS — Z79899 Other long term (current) drug therapy: Secondary | ICD-10-CM | POA: Diagnosis not present

## 2023-04-22 DIAGNOSIS — X58XXXA Exposure to other specified factors, initial encounter: Secondary | ICD-10-CM | POA: Diagnosis not present

## 2023-05-07 DIAGNOSIS — S76312A Strain of muscle, fascia and tendon of the posterior muscle group at thigh level, left thigh, initial encounter: Secondary | ICD-10-CM | POA: Diagnosis not present

## 2023-05-20 DIAGNOSIS — R29898 Other symptoms and signs involving the musculoskeletal system: Secondary | ICD-10-CM | POA: Diagnosis not present

## 2023-05-20 DIAGNOSIS — S76312A Strain of muscle, fascia and tendon of the posterior muscle group at thigh level, left thigh, initial encounter: Secondary | ICD-10-CM | POA: Diagnosis not present

## 2023-05-26 DIAGNOSIS — S76312A Strain of muscle, fascia and tendon of the posterior muscle group at thigh level, left thigh, initial encounter: Secondary | ICD-10-CM | POA: Diagnosis not present

## 2023-05-26 DIAGNOSIS — R29898 Other symptoms and signs involving the musculoskeletal system: Secondary | ICD-10-CM | POA: Diagnosis not present

## 2023-06-04 DIAGNOSIS — R29898 Other symptoms and signs involving the musculoskeletal system: Secondary | ICD-10-CM | POA: Diagnosis not present

## 2023-06-04 DIAGNOSIS — Z9889 Other specified postprocedural states: Secondary | ICD-10-CM | POA: Diagnosis not present

## 2023-06-04 DIAGNOSIS — S76312A Strain of muscle, fascia and tendon of the posterior muscle group at thigh level, left thigh, initial encounter: Secondary | ICD-10-CM | POA: Diagnosis not present

## 2023-06-09 DIAGNOSIS — S76312A Strain of muscle, fascia and tendon of the posterior muscle group at thigh level, left thigh, initial encounter: Secondary | ICD-10-CM | POA: Diagnosis not present

## 2023-06-09 DIAGNOSIS — R29898 Other symptoms and signs involving the musculoskeletal system: Secondary | ICD-10-CM | POA: Diagnosis not present

## 2023-06-16 DIAGNOSIS — S76312A Strain of muscle, fascia and tendon of the posterior muscle group at thigh level, left thigh, initial encounter: Secondary | ICD-10-CM | POA: Diagnosis not present

## 2023-06-16 DIAGNOSIS — R29898 Other symptoms and signs involving the musculoskeletal system: Secondary | ICD-10-CM | POA: Diagnosis not present

## 2023-06-23 DIAGNOSIS — S76312A Strain of muscle, fascia and tendon of the posterior muscle group at thigh level, left thigh, initial encounter: Secondary | ICD-10-CM | POA: Diagnosis not present

## 2023-06-23 DIAGNOSIS — R29898 Other symptoms and signs involving the musculoskeletal system: Secondary | ICD-10-CM | POA: Diagnosis not present

## 2023-06-25 DIAGNOSIS — S76312A Strain of muscle, fascia and tendon of the posterior muscle group at thigh level, left thigh, initial encounter: Secondary | ICD-10-CM | POA: Diagnosis not present

## 2023-06-25 DIAGNOSIS — R29898 Other symptoms and signs involving the musculoskeletal system: Secondary | ICD-10-CM | POA: Diagnosis not present

## 2023-06-29 DIAGNOSIS — S76312A Strain of muscle, fascia and tendon of the posterior muscle group at thigh level, left thigh, initial encounter: Secondary | ICD-10-CM | POA: Diagnosis not present

## 2023-06-29 DIAGNOSIS — R29898 Other symptoms and signs involving the musculoskeletal system: Secondary | ICD-10-CM | POA: Diagnosis not present

## 2023-07-01 DIAGNOSIS — R29898 Other symptoms and signs involving the musculoskeletal system: Secondary | ICD-10-CM | POA: Diagnosis not present

## 2023-07-01 DIAGNOSIS — S76312A Strain of muscle, fascia and tendon of the posterior muscle group at thigh level, left thigh, initial encounter: Secondary | ICD-10-CM | POA: Diagnosis not present

## 2023-07-02 DIAGNOSIS — M533 Sacrococcygeal disorders, not elsewhere classified: Secondary | ICD-10-CM | POA: Diagnosis not present

## 2023-07-09 DIAGNOSIS — S76312A Strain of muscle, fascia and tendon of the posterior muscle group at thigh level, left thigh, initial encounter: Secondary | ICD-10-CM | POA: Diagnosis not present

## 2023-07-09 DIAGNOSIS — R29898 Other symptoms and signs involving the musculoskeletal system: Secondary | ICD-10-CM | POA: Diagnosis not present

## 2023-07-20 DIAGNOSIS — S76312A Strain of muscle, fascia and tendon of the posterior muscle group at thigh level, left thigh, initial encounter: Secondary | ICD-10-CM | POA: Diagnosis not present

## 2023-07-20 DIAGNOSIS — Z9889 Other specified postprocedural states: Secondary | ICD-10-CM | POA: Diagnosis not present

## 2023-07-28 DIAGNOSIS — S76312A Strain of muscle, fascia and tendon of the posterior muscle group at thigh level, left thigh, initial encounter: Secondary | ICD-10-CM | POA: Diagnosis not present

## 2023-07-28 DIAGNOSIS — R29898 Other symptoms and signs involving the musculoskeletal system: Secondary | ICD-10-CM | POA: Diagnosis not present

## 2023-07-30 DIAGNOSIS — R29898 Other symptoms and signs involving the musculoskeletal system: Secondary | ICD-10-CM | POA: Diagnosis not present

## 2023-07-30 DIAGNOSIS — S76312A Strain of muscle, fascia and tendon of the posterior muscle group at thigh level, left thigh, initial encounter: Secondary | ICD-10-CM | POA: Diagnosis not present

## 2023-08-04 DIAGNOSIS — S76312A Strain of muscle, fascia and tendon of the posterior muscle group at thigh level, left thigh, initial encounter: Secondary | ICD-10-CM | POA: Diagnosis not present

## 2023-08-04 DIAGNOSIS — R29898 Other symptoms and signs involving the musculoskeletal system: Secondary | ICD-10-CM | POA: Diagnosis not present

## 2023-08-06 DIAGNOSIS — S76312A Strain of muscle, fascia and tendon of the posterior muscle group at thigh level, left thigh, initial encounter: Secondary | ICD-10-CM | POA: Diagnosis not present

## 2023-08-06 DIAGNOSIS — R29898 Other symptoms and signs involving the musculoskeletal system: Secondary | ICD-10-CM | POA: Diagnosis not present

## 2023-08-10 DIAGNOSIS — S76312A Strain of muscle, fascia and tendon of the posterior muscle group at thigh level, left thigh, initial encounter: Secondary | ICD-10-CM | POA: Diagnosis not present

## 2023-08-10 DIAGNOSIS — R29898 Other symptoms and signs involving the musculoskeletal system: Secondary | ICD-10-CM | POA: Diagnosis not present

## 2023-08-13 DIAGNOSIS — R29898 Other symptoms and signs involving the musculoskeletal system: Secondary | ICD-10-CM | POA: Diagnosis not present

## 2023-08-13 DIAGNOSIS — S76312A Strain of muscle, fascia and tendon of the posterior muscle group at thigh level, left thigh, initial encounter: Secondary | ICD-10-CM | POA: Diagnosis not present

## 2023-08-24 DIAGNOSIS — R29898 Other symptoms and signs involving the musculoskeletal system: Secondary | ICD-10-CM | POA: Diagnosis not present

## 2023-08-24 DIAGNOSIS — S76312A Strain of muscle, fascia and tendon of the posterior muscle group at thigh level, left thigh, initial encounter: Secondary | ICD-10-CM | POA: Diagnosis not present

## 2023-08-26 DIAGNOSIS — R42 Dizziness and giddiness: Secondary | ICD-10-CM | POA: Diagnosis not present

## 2023-08-31 DIAGNOSIS — S76312A Strain of muscle, fascia and tendon of the posterior muscle group at thigh level, left thigh, initial encounter: Secondary | ICD-10-CM | POA: Diagnosis not present

## 2023-08-31 DIAGNOSIS — R29898 Other symptoms and signs involving the musculoskeletal system: Secondary | ICD-10-CM | POA: Diagnosis not present

## 2023-09-03 DIAGNOSIS — R29898 Other symptoms and signs involving the musculoskeletal system: Secondary | ICD-10-CM | POA: Diagnosis not present

## 2023-09-03 DIAGNOSIS — S76312A Strain of muscle, fascia and tendon of the posterior muscle group at thigh level, left thigh, initial encounter: Secondary | ICD-10-CM | POA: Diagnosis not present

## 2023-09-17 DIAGNOSIS — N390 Urinary tract infection, site not specified: Secondary | ICD-10-CM | POA: Diagnosis not present

## 2023-10-02 DIAGNOSIS — N390 Urinary tract infection, site not specified: Secondary | ICD-10-CM | POA: Diagnosis not present

## 2023-10-22 DIAGNOSIS — Z9889 Other specified postprocedural states: Secondary | ICD-10-CM | POA: Diagnosis not present

## 2023-10-22 DIAGNOSIS — S76312D Strain of muscle, fascia and tendon of the posterior muscle group at thigh level, left thigh, subsequent encounter: Secondary | ICD-10-CM | POA: Diagnosis not present

## 2023-10-30 DIAGNOSIS — Z13220 Encounter for screening for lipoid disorders: Secondary | ICD-10-CM | POA: Diagnosis not present

## 2023-10-30 DIAGNOSIS — N926 Irregular menstruation, unspecified: Secondary | ICD-10-CM | POA: Diagnosis not present

## 2023-10-30 DIAGNOSIS — Z13228 Encounter for screening for other metabolic disorders: Secondary | ICD-10-CM | POA: Diagnosis not present

## 2023-10-30 DIAGNOSIS — R636 Underweight: Secondary | ICD-10-CM | POA: Diagnosis not present

## 2023-10-30 DIAGNOSIS — Z131 Encounter for screening for diabetes mellitus: Secondary | ICD-10-CM | POA: Diagnosis not present

## 2023-10-30 DIAGNOSIS — Z01419 Encounter for gynecological examination (general) (routine) without abnormal findings: Secondary | ICD-10-CM | POA: Diagnosis not present

## 2023-11-30 DIAGNOSIS — L719 Rosacea, unspecified: Secondary | ICD-10-CM | POA: Diagnosis not present

## 2023-12-31 DIAGNOSIS — G118 Other hereditary ataxias: Secondary | ICD-10-CM | POA: Diagnosis not present

## 2024-01-04 ENCOUNTER — Encounter: Admitting: Skilled Nursing Facility1
